# Patient Record
Sex: Female | Born: 1975 | Race: White | Hispanic: No | Marital: Married | Smoking: Former smoker
Health system: Southern US, Community
[De-identification: ages and names within clinical notes are randomized; demographics above are authoritative.]

## PROBLEM LIST (undated history)

## (undated) DIAGNOSIS — B2790 Infectious mononucleosis, unspecified without complication: Secondary | ICD-10-CM

## (undated) DIAGNOSIS — R51 Headache: Secondary | ICD-10-CM

## (undated) DIAGNOSIS — R519 Headache, unspecified: Secondary | ICD-10-CM

## (undated) DIAGNOSIS — D649 Anemia, unspecified: Secondary | ICD-10-CM

## (undated) DIAGNOSIS — N879 Dysplasia of cervix uteri, unspecified: Secondary | ICD-10-CM

## (undated) DIAGNOSIS — K5900 Constipation, unspecified: Secondary | ICD-10-CM

## (undated) DIAGNOSIS — F419 Anxiety disorder, unspecified: Secondary | ICD-10-CM

## (undated) DIAGNOSIS — G43909 Migraine, unspecified, not intractable, without status migrainosus: Secondary | ICD-10-CM

## (undated) HISTORY — DX: Headache, unspecified: R51.9

## (undated) HISTORY — DX: Anxiety disorder, unspecified: F41.9

## (undated) HISTORY — DX: Anemia, unspecified: D64.9

## (undated) HISTORY — PX: CERVICAL BIOPSY  W/ LOOP ELECTRODE EXCISION: SUR135

## (undated) HISTORY — DX: Migraine, unspecified, not intractable, without status migrainosus: G43.909

## (undated) HISTORY — DX: Headache: R51

## (undated) HISTORY — DX: Constipation, unspecified: K59.00

## (undated) HISTORY — DX: Infectious mononucleosis, unspecified without complication: B27.90

## (undated) HISTORY — PX: THYROID CYST EXCISION: SHX2511

## (undated) HISTORY — DX: Dysplasia of cervix uteri, unspecified: N87.9

---

## 2003-11-18 ENCOUNTER — Other Ambulatory Visit: Admission: RE | Admit: 2003-11-18 | Discharge: 2003-11-18 | Payer: Self-pay | Admitting: Obstetrics and Gynecology

## 2004-04-27 ENCOUNTER — Ambulatory Visit: Payer: Self-pay | Admitting: Internal Medicine

## 2004-07-06 ENCOUNTER — Ambulatory Visit: Payer: Self-pay | Admitting: Internal Medicine

## 2004-11-27 ENCOUNTER — Other Ambulatory Visit: Admission: RE | Admit: 2004-11-27 | Discharge: 2004-11-27 | Payer: Self-pay | Admitting: Obstetrics and Gynecology

## 2005-05-14 ENCOUNTER — Ambulatory Visit (HOSPITAL_COMMUNITY): Admission: RE | Admit: 2005-05-14 | Discharge: 2005-05-14 | Payer: Self-pay | Admitting: Obstetrics and Gynecology

## 2005-07-05 ENCOUNTER — Inpatient Hospital Stay (HOSPITAL_COMMUNITY): Admission: AD | Admit: 2005-07-05 | Discharge: 2005-07-08 | Payer: Self-pay | Admitting: Obstetrics and Gynecology

## 2005-10-09 ENCOUNTER — Ambulatory Visit: Payer: Self-pay | Admitting: Internal Medicine

## 2005-10-11 ENCOUNTER — Ambulatory Visit: Payer: Self-pay | Admitting: Internal Medicine

## 2005-12-22 ENCOUNTER — Emergency Department (HOSPITAL_COMMUNITY): Admission: EM | Admit: 2005-12-22 | Discharge: 2005-12-23 | Payer: Self-pay | Admitting: Emergency Medicine

## 2005-12-23 ENCOUNTER — Ambulatory Visit (HOSPITAL_COMMUNITY): Admission: RE | Admit: 2005-12-23 | Discharge: 2005-12-23 | Payer: Self-pay | Admitting: Neurology

## 2005-12-27 ENCOUNTER — Ambulatory Visit: Payer: Self-pay | Admitting: Internal Medicine

## 2005-12-30 ENCOUNTER — Other Ambulatory Visit: Admission: RE | Admit: 2005-12-30 | Discharge: 2005-12-30 | Payer: Self-pay | Admitting: Obstetrics and Gynecology

## 2006-03-21 ENCOUNTER — Encounter: Admission: RE | Admit: 2006-03-21 | Discharge: 2006-03-21 | Payer: Self-pay | Admitting: Neurology

## 2006-07-02 ENCOUNTER — Encounter: Admission: RE | Admit: 2006-07-02 | Discharge: 2006-07-02 | Payer: Self-pay | Admitting: Obstetrics and Gynecology

## 2006-09-15 ENCOUNTER — Ambulatory Visit: Payer: Self-pay | Admitting: Internal Medicine

## 2007-01-02 ENCOUNTER — Ambulatory Visit: Payer: Self-pay | Admitting: Internal Medicine

## 2007-01-02 DIAGNOSIS — M542 Cervicalgia: Secondary | ICD-10-CM | POA: Insufficient documentation

## 2007-01-02 DIAGNOSIS — R87619 Unspecified abnormal cytological findings in specimens from cervix uteri: Secondary | ICD-10-CM | POA: Insufficient documentation

## 2007-01-07 ENCOUNTER — Telehealth: Payer: Self-pay | Admitting: Internal Medicine

## 2007-01-16 ENCOUNTER — Encounter: Admission: RE | Admit: 2007-01-16 | Discharge: 2007-01-16 | Payer: Self-pay | Admitting: Internal Medicine

## 2008-09-09 ENCOUNTER — Inpatient Hospital Stay (HOSPITAL_COMMUNITY): Admission: AD | Admit: 2008-09-09 | Discharge: 2008-09-12 | Payer: Self-pay | Admitting: Obstetrics and Gynecology

## 2010-06-10 LAB — CBC
HCT: 30 % — ABNORMAL LOW (ref 36.0–46.0)
MCHC: 33.2 g/dL (ref 30.0–36.0)
Platelets: 249 10*3/uL (ref 150–400)
RBC: 3.56 MIL/uL — ABNORMAL LOW (ref 3.87–5.11)
RBC: 3.89 MIL/uL (ref 3.87–5.11)
RDW: 14.2 % (ref 11.5–15.5)
WBC: 14.1 10*3/uL — ABNORMAL HIGH (ref 4.0–10.5)

## 2010-06-10 LAB — CCBB MATERNAL DONOR DRAW

## 2010-06-10 LAB — RPR: RPR Ser Ql: NONREACTIVE

## 2010-07-17 NOTE — H&P (Signed)
Tanya Bush, Tanya Bush                ACCOUNT NO.:  [id number]   MEDICAL RECORD NO.:  [id number]          PATIENT TYPE:  INP   LOCATION:  9162                          FACILITY:  [facility]   PHYSICIAN:  Janine Limbo, M.D.DATE OF BIRTH:  Aug 02, 1975   DATE OF ADMISSION:  09/09/2008  DATE OF DISCHARGE:                              HISTORY & PHYSICAL   The patient is a 35 year old married white female gravida 2, para 1-0-0-  1 at 39-2/7 weeks per an [facility] of September 15, 2007, who presents in active  labor.  Reports irregular contractions this evening which increased in  frequency around 10:30 and been approximately 4 to 5 minutes since that  time.  Denied leakage of fluid, vaginal bleeding, UTI or PIH signs or  symptoms, positive fetal movement followed by the MD service at [facility].   HISTORY:  1. History of LEEP in 2002.  2. Patient with mild hearing loss and strong family history of hearing      loss on her father's side of family.  3. Anemia.  4. PENICILLIN allergy.   PRENATAL LABS:  Blood type is O+, Rh antibody screen negative RPR  nonreactive, rubella titer immune.  Hepatitis surface antigen negative,  HIV nonreactive.  Cystic fibrosis screen negative.  Pap smear March,  2009 was within normal limits.  She declined gonorrhea and chlamydia  cultures her hemoglobin December 17 was 12.2, hematocrit 38.3, platelets  were 344.   ALLERGIES:  She has a PENICILLIN allergy.  Denies latex allergies.  Some  seasonal allergies.   MENSTRUAL HISTORY:  Menarche age 12.  She usually has 30 to 35 day  cycles.  Seven days of flow.  Reported an LMP of December 08, 2007, giving  her an [facility] of September 14, 2008.   OBSTETRICAL HISTORY:  Gravida 1, spontaneous vaginal delivery May 2007,  a female named Tanya Bush, born at [redacted] weeks gestation after 12 hours of  labor, weighing 6 pounds 11 ounces.  She did have an epidural.  No  complications.  Was delivered by Dr. Molly Maduro.  Gravida 2 this current  pregnancy.   PAST  MEDICAL HISTORY:  She has used Ortho Tri-Cyclen for contraception  in the past and also had a Mirena placed after her first child.  She had  a history of abnormal Pap, had a LEEP in 2002.  Varicella as a child and  normal childhood illnesses.  She reports that her hemoglobin is usually  low.  Prone to constipation, history of frequent UTIs 2000-2002, but  now rare cystitis.  Does report history of bad knees   FAMILY HISTORY:  Father high blood pressure, maternal uncle and maternal  first cousin diabetic.  Dad has hyperthyroidism.  Maternal uncle kidney  disease.  Maternal uncle, maternal cousin celiac disease.  Maternal  grandfather colon cancer.  Her father had strong history on his side of  the family of hearing loss.  The patient does have mild hearing loss  herself.   GENETIC HISTORY:  Remarkable for father of baby is from [place].   SOCIAL HISTORY:  She is a married  white female.  She is of [place]  faith.  Her husband's name is __________, he is [place], from [place].  The patient has had 17-1/2 years of education.  She is an occupational  therapy.  Her husband has 13 years of education and is full-time Engineer, agricultural.  She denies tobacco, alcohol or illicit drug use.   HISTORY OF PRESENT PREGNANCY:  She entered care November 13 for her new  OB interview.  She was around 5-1/2 weeks.  She reported pre-gravid  weight of 128.  Her weight on December 17 was 136.  She is 5 feet 4  inches, so has an normal BMI.  Her new OB workup was on December 17 with  her prenatal labs drawn.  She had a quad screen that was within normal  limits.  She had anatomy ultrasound at around 19 weeks with normal fluid  and growth and development.  Cervical length was 3.78 cm.  Did start a  Z-Pak middle of March..  She did have a yeast infection during that time  while she was on the Z-Pak, around 23 weeks, and was treated with  Terazol for her 1-week.  The patient did verbalize at 23 weeks  consideration for  postpartum vasectomy.  She did have a Pap sent on  March 17.  Her Glucola was done at 27 weeks and was within normal limits  equal to 108, Hemoglobin at that time was 9.8 and was started on iron.  RPR at the time was nonreactive.  Complained of some charley horses in  her legs around 29 weeks.  She did start measuring a little bit a week  to 2 weeks behind at that time.  Weight was 147.  She had an ultrasound  at 31 weeks for size less than dates.  Had cervical length of 3.41, and  estimated fetal weight was in the 69th percentile.  Fluid was 10th  percentile.  The patient was upset and crying at that visit and had  planned to come back to the office in 3 days.  When she returned, she  had a repeat ultrasound and AFI was 17.28 after vigorous hydration.  Did  complain of some occasional palpitations and shortness of breath.  Had a  TSH checked as well as CMET. Her lungs were clear and regular rate and  rhythm.  GBS was done at 36 weeks and was negative.  She had been about  2 cm in the office at 36 weeks.  Complained of some decreased fetal  movement on July 6 and was sent for an ultrasound on July 6.  Estimated  fetal weight was in the 68th.5th percentile and was approximately 7  pounds 11 ounces.  AFI was 11.52, which was 35th percentile.  BPP was  8/10.  A vertex placenta was grade 3 anterior.  The patient did have an  appointment in the office today but the records are not available.  Her  weight on July 6 was 151.   OBJECTIVE:  VITAL SIGNS ON ADMISSION:  Blood pressure 129/67, heart rate  77, temperature 978 and respirations were 21.  CBC white blood cell count 12.5, hemoglobin 10.8, hematocrit 32.6,  platelets were 338.  GENERAL:  She was then noted distress and had significant discomfort was  alert, oriented x3 heat within normal limits.  CARDIOVASCULAR:  Regular  rate and rhythm without murmur.  LUNGS:  Clear to auscultation bilaterally.  ABDOMEN:  Soft, nontender, gravid.   Cervix 4-5 cm 100% -  1 with bulging  membranes.  EXTREMITIES: No edema are clonus and DTRs 2+.  Fetal heart rate 135  reactive, no D cells, moderate variability.  Toco uterine contractions  irregular every 2-6 minutes, moderate on palpation.   IMPRESSION:  1. Intrauterine pregnancy at 39-2.  2. Early active labor.  3. Reactive fetal heart tracing.  4. Group beta strep negative.  5. Desires epidural.   PLAN:  1. Admit to birthing suites with Dr. Stefano Gaul as attending physician.  2. Routine L&D orders.  3. Epidural as soon as possible.  4. MD is to follow and plan artificial rupture of membranes for      augmentation.      Candice Denny Levy, [place]      Janine Limbo, M.D.  Electronically Signed    CHS/MEDQ  D:  09/10/2008  T:  09/10/2008  Job:  [id number]

## 2010-07-20 NOTE — H&P (Signed)
NAMEANSLIE, SPADAFORA NO.:  [id number]   MEDICAL RECORD NO.:  [id number]          PATIENT TYPE:  INP   LOCATION:  9169                          [facility]  [facility]   PHYSICIAN:  Osborn Coho, M.D.   DATE OF BIRTH:  11/11/1975   DATE OF ADMISSION:  07/05/2005  DATE OF DISCHARGE:                                HISTORY & PHYSICAL   HISTORY OF PRESENT ILLNESS:  Tanya Bush is a 35 year old, gravida 1, para 0  who was admitted at 39-4/[redacted] weeks gestation with complaints of painful  regular contractions throughout the evening. The patient denies any vaginal  bleeding and reports her fetus is moving normally. The patient questions  whether she may be leaking some amniotic fluid due to some increased thin  discharge. The patient was previously evaluated at [facility] office in the a.m.  of the day of admission for a similar complaint and was found to have no  leakage of amniotic fluid and cervix 3-4 cm at that time. The patient's  pregnancy remarkable for:   1.  History of abnormal Pap status post LEEP.  2.  Toxo risk.  3.  Family and patient with hearing loss.   PRENATAL LABS:  Hemoglobin 12.9, platelets 355,000, blood type O+, antibody  screen negative. RPR nonreactive, rubella titer immune. Hepatitis B surface  antigen negative, HIV nonreactive. Cystic fibrosis negative. Toxotiters IgG,  IgM respectively were both negative. Quad screen within normal limits.  Glucola at 27 weeks 113, hemoglobin at 27 weeks 10.0. Group B strep  negative. GC chlamydia cultures declined at 36 weeks.   HISTORY OF PRESENT PREGNANCY:  The patient entered care at [redacted] weeks  gestation. Ultrasound obtained at that time consistent with 8 weeks and 1  day with [facility] Jul 08, 2005 which is the patient's best EDC secondary to a  questionable LMP. The patient's pregnancy has been followed by the MD  service at [facility]. The patient underwent nuchal  translucency screening only which was within  normal limits. Quad screen  obtained at 7 weeks was within normal limits. Ultrasound for anatomy done at  19 weeks with normal anatomy, female infant, cervix 3.7 cm and normal  amniotic fluid. Patient with some complaints of nasal congestion at 27 weeks  and patient started on Tandem at 27 weeks secondary to a hemoglobin of 10.0.  Patient with complaints of left upper quadrant pain at [redacted] weeks gestation  with benign findings. The patient declined third trimester GC and chlamydia  36 weeks. Cervix 2 cm at 37 weeks and 3 cm at 38 weeks and 4 days. Patient  with episode of shortness of breath and slightly elevated heart rate at 39  weeks which resolved spontaneously without syncope. The remainder of  pregnancy is unremarkable.   OB HISTORY:  Pregnancy #1.   GYN HISTORY:  Patient with 30-35 day cycles with menarche at age 49. Patient  with a history of OCP use from 23 to 2000 and the condom. 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 Patient with a LEEP in 2002. Last 3 Paps  were all  within normal limits with the last Pap being September 2005 prior  to onset of prenatal care. Patient with a history of HPV and occasional  yeast however denies other STDs.   PAST MEDICAL HISTORY:  Significant for mild anemia and prone to constipation  and history of frequent UTIs in the past. Complaints of joint problems.   CURRENT MEDICATIONS:  Prenatal vitamins and Zyrtec.   ALLERGIES:  PENICILLIN questionably.   PAST SURGICAL HISTORY:  LEEP only.   FAMILY HISTORY:  Father with hypertension, hyperthyroidism.   GENETIC HISTORY:  The patient's father and his family as well as patient  with mild hearing loss otherwise negative genetic history.   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 Father of the baby, Cait Locust, with 13 years of education is  involved and supportive and works in Firefighter. The patient and  her partner are  [place] in their faith. The patient denies the use of  alcohol, tobacco or street drugs.   PHYSICAL EXAMINATION:  VITAL SIGNS:  The patient is afebrile, vital signs  are stable.  HEENT:  Within normal limits.  LUNGS:  Clear.  HEART:  Regular rate and rhythm.  BREASTS:  Soft.  ABDOMEN:  Soft, gravida and nontender. Fetal heart rate 140s and reassuring.  Uterine contractions every 3-5 minutes.  CERVICAL EXAM:  Found cervix to be 3-4 cm dilated, 75% effaced, -2, -3  station.  EXTREMITIES:  No edema.   ASSESSMENT:  1.  Intrauterine pregnancy at 39-4/7 weeks.  2.  Early labor.   PLAN:  The patient to be admitted to birthing suite per consult with Dr.  Su Hilt. Routine MD orders. The patient may plan epidural during the course  of her labor however, she declines pain medicine at the present moment.      Tanya Bush, CNM      Osborn Coho, M.D.  Electronically Signed    NOS/MEDQ  D:  07/06/2005  T:  07/06/2005  Job:  [id number]

## 2011-02-01 ENCOUNTER — Other Ambulatory Visit: Payer: Self-pay | Admitting: Family Medicine

## 2011-02-01 DIAGNOSIS — E041 Nontoxic single thyroid nodule: Secondary | ICD-10-CM

## 2011-02-04 ENCOUNTER — Ambulatory Visit
Admission: RE | Admit: 2011-02-04 | Discharge: 2011-02-04 | Disposition: A | Discharge status: Home / Self Care | Payer: 59 | Source: Ambulatory Visit | Attending: Family Medicine | Admitting: Family Medicine

## 2011-02-04 DIAGNOSIS — E041 Nontoxic single thyroid nodule: Secondary | ICD-10-CM

## 2011-02-08 ENCOUNTER — Other Ambulatory Visit: Payer: Self-pay

## 2011-02-08 ENCOUNTER — Other Ambulatory Visit: Payer: Self-pay | Admitting: Family Medicine

## 2011-02-08 DIAGNOSIS — E041 Nontoxic single thyroid nodule: Secondary | ICD-10-CM

## 2011-02-12 ENCOUNTER — Other Ambulatory Visit (HOSPITAL_COMMUNITY)
Admission: RE | Admit: 2011-02-12 | Discharge: 2011-02-12 | Disposition: A | Discharge status: Home / Self Care | Payer: 59 | Source: Ambulatory Visit | Attending: Interventional Radiology | Admitting: Interventional Radiology

## 2011-02-12 ENCOUNTER — Ambulatory Visit
Admission: RE | Admit: 2011-02-12 | Discharge: 2011-02-12 | Disposition: A | Discharge status: Home / Self Care | Payer: 59 | Source: Ambulatory Visit | Attending: Family Medicine | Admitting: Family Medicine

## 2011-02-12 DIAGNOSIS — E049 Nontoxic goiter, unspecified: Secondary | ICD-10-CM | POA: Insufficient documentation

## 2011-02-12 DIAGNOSIS — E041 Nontoxic single thyroid nodule: Secondary | ICD-10-CM

## 2011-03-27 ENCOUNTER — Other Ambulatory Visit: Payer: Self-pay | Admitting: Endocrinology

## 2011-03-27 DIAGNOSIS — E041 Nontoxic single thyroid nodule: Secondary | ICD-10-CM

## 2011-09-09 ENCOUNTER — Ambulatory Visit
Admission: RE | Admit: 2011-09-09 | Discharge: 2011-09-09 | Disposition: A | Discharge status: Home / Self Care | Payer: 59 | Source: Ambulatory Visit | Attending: Endocrinology | Admitting: Endocrinology

## 2011-09-09 ENCOUNTER — Other Ambulatory Visit: Payer: 59

## 2011-09-09 DIAGNOSIS — E041 Nontoxic single thyroid nodule: Secondary | ICD-10-CM

## 2012-01-07 ENCOUNTER — Encounter: Payer: Self-pay | Admitting: Obstetrics and Gynecology

## 2012-01-07 ENCOUNTER — Ambulatory Visit (INDEPENDENT_AMBULATORY_CARE_PROVIDER_SITE_OTHER): Payer: 59 | Admitting: Obstetrics and Gynecology

## 2012-01-07 VITALS — BP 104/78 | HR 66 | Ht 64.0 in | Wt 138.0 lb

## 2012-01-07 DIAGNOSIS — R51 Headache: Secondary | ICD-10-CM

## 2012-01-07 DIAGNOSIS — Z01419 Encounter for gynecological examination (general) (routine) without abnormal findings: Secondary | ICD-10-CM

## 2012-01-07 DIAGNOSIS — Z124 Encounter for screening for malignant neoplasm of cervix: Secondary | ICD-10-CM

## 2012-01-07 DIAGNOSIS — R519 Headache, unspecified: Secondary | ICD-10-CM

## 2012-01-07 MED ORDER — BUTALBITAL-APAP-CAFFEINE 50-325-40 MG PO TABS: 1.0000 | ORAL_TABLET | Freq: Four times a day (QID) | ORAL | Status: AC | PRN

## 2012-01-07 NOTE — Progress Notes (Signed)
ANNUAL GYNECOLOGIC EXAMINATION   Tanya Bush is a 36 y.o. female, G2P2, who presents for an annual exam. The patient had a Mirena IUD placed in 2010.  Her menstrual cycles are better.  She continues to have PMS symptoms.  She reports having headaches for one week prior to her cycle.  This has been better the last 2 cycles.  It was quite uncomfortable over the summer.  The patient has a thyroid nodule that was found to be benign by her endocrinologist.  She complains of trouble controlling her weight. The patient had a conization in 2006 for a high-grade lesion.  Her most recent Pap smears have been normal.   History   Social History  . Marital Status: Married    Spouse Name: N/A    Number of Children: N/A  . Years of Education: N/A   Social History Main Topics  . Smoking status: Never Smoker   . Smokeless tobacco: None  . Alcohol Use: Yes     Comment: ocassionally   . Drug Use: No  . Sexually Active: None     Comment: Mirena inserted 08/2005   Other Topics Concern  . None   Social History Narrative  . None    Menstrual cycle:   LMP: No LMP recorded.             The following portions of the patient's history were reviewed and updated as appropriate: allergies, current medications, past family history, past medical history, past social history, past surgical history and problem list.  Review of Systems Pertinent items are noted in HPI. Breast:Negative for breast lump,nipple discharge or nipple retraction Gastrointestinal: Negative for abdominal pain, change in bowel habits or rectal bleeding Urinary:negative   Objective:    BP 104/78  Pulse 66  Ht 5\' 4"  (1.626 m)  Wt 138 lb (62.596 kg)  BMI 23.69 kg/m2    Weight:  Wt Readings from Last 1 Encounters:  01/07/12 138 lb (62.596 kg)          BMI: Body mass index is 23.69 kg/(m^2).  General Appearance: Alert, appropriate appearance for age. No acute distress HEENT: Grossly normal Neck / Thyroid: Supple, no masses,  nodes or enlargement Lungs: clear to auscultation bilaterally Back: No CVA tenderness Breast Exam: No masses or nodes.No dimpling, nipple retraction or discharge. Cardiovascular: Regular rate and rhythm. S1, S2, no murmur Gastrointestinal: Soft, non-tender, no masses or organomegaly  ++++++++++++++++++++++++++++++++++++++++++++++++++++++++  Pelvic Exam: External genitalia: normal general appearance Vaginal: normal without tenderness, induration or masses. Relaxation: Yes Cervix: normal appearance Adnexa: normal bimanual exam Uterus: normal size, shape, and consistency Rectovaginal: normal rectal, no masses  ++++++++++++++++++++++++++++++++++++++++++++++++++++++++  Lymphatic Exam: Non-palpable nodes in neck, clavicular, axillary, or inguinal regions Neurologic: Normal speech, no tremor  Psychiatric: Alert and oriented, appropriate affect.  Assessment:    Normal gyn exam   Overweight or obese: Yes   Pelvic relaxation: Yes  Headaches  PMS  Conization of the cervix in 2006 because of a high grade lesion.   Plan:    pap smear return annually or prn Contraception:IUD   Medications prescribed: Fioricet for headaches  STD screen request: No   The updated Pap smear screening guidelines were discussed with the patient. The patient requested that I obtain a Pap smear: Yes.  Kegel exercises discussed: Yes.  Proper diet and regular exercise were reviewed.  Annual mammograms recommended starting at age 55. Proper breast care was discussed.  Screening colonoscopy is recommended beginning at age 32.  Regular health  maintenance was reviewed.  Sleep hygiene was discussed.  Adequate calcium and vitamin D intake was emphasized.  Leonard Schwartz M.D.    Regular Periods: yes Mammogram: no  Monthly Breast Ex.: no Exercise: yes sometimes  Tetanus < 10 years: yes Seatbelts: yes  NI. Bladder Functn.: yes Abuse at home: no  Daily BM's: yes Stressful Work: yes    Healthy Diet: yes Sigmoid-Colonoscopy: n/a  Calcium: no Medical problems this year: headaches before periods   LAST PAP:08/01/10  Contraception: mirena  Mammogram:  n/a  PCP: Dr. Zachery Dauer at [facility] on [address]  PMH:  No changes  FMH: no changes  Last Bone Scan: n/a

## 2012-01-08 LAB — PAP IG W/ RFLX HPV ASCU

## 2012-11-20 ENCOUNTER — Other Ambulatory Visit: Payer: Self-pay | Admitting: Endocrinology

## 2012-11-20 DIAGNOSIS — E049 Nontoxic goiter, unspecified: Secondary | ICD-10-CM

## 2012-11-27 ENCOUNTER — Ambulatory Visit
Admission: RE | Admit: 2012-11-27 | Discharge: 2012-11-27 | Disposition: A | Discharge status: Home / Self Care | Payer: 59 | Source: Ambulatory Visit | Attending: Endocrinology | Admitting: Endocrinology

## 2012-11-27 DIAGNOSIS — E049 Nontoxic goiter, unspecified: Secondary | ICD-10-CM

## 2014-01-03 ENCOUNTER — Encounter: Payer: Self-pay | Admitting: Obstetrics and Gynecology

## 2014-04-16 ENCOUNTER — Inpatient Hospital Stay (HOSPITAL_COMMUNITY)
Admission: AD | Admit: 2014-04-16 | Discharge: 2014-04-16 | Disposition: A | Discharge status: Home / Self Care | Payer: 59 | Source: Ambulatory Visit | Attending: Obstetrics and Gynecology | Admitting: Obstetrics and Gynecology

## 2014-04-16 ENCOUNTER — Encounter (HOSPITAL_COMMUNITY): Payer: Self-pay | Admitting: *Deleted

## 2014-04-16 ENCOUNTER — Inpatient Hospital Stay (HOSPITAL_COMMUNITY): Payer: 59

## 2014-04-16 DIAGNOSIS — R58 Hemorrhage, not elsewhere classified: Secondary | ICD-10-CM

## 2014-04-16 DIAGNOSIS — N939 Abnormal uterine and vaginal bleeding, unspecified: Secondary | ICD-10-CM | POA: Insufficient documentation

## 2014-04-16 LAB — URINALYSIS, ROUTINE W REFLEX MICROSCOPIC
BILIRUBIN URINE: NEGATIVE
Glucose, UA: NEGATIVE mg/dL
KETONES UR: NEGATIVE mg/dL
NITRITE: NEGATIVE
PH: 5.5 (ref 5.0–8.0)
PROTEIN: NEGATIVE mg/dL
Urobilinogen, UA: 0.2 mg/dL (ref 0.0–1.0)

## 2014-04-16 LAB — CBC WITH DIFFERENTIAL/PLATELET
BASOS ABS: 0 10*3/uL (ref 0.0–0.1)
BASOS PCT: 0 % (ref 0–1)
EOS ABS: 0.2 10*3/uL (ref 0.0–0.7)
EOS PCT: 2 % (ref 0–5)
HEMATOCRIT: 37.8 % (ref 36.0–46.0)
HEMOGLOBIN: 12.9 g/dL (ref 12.0–15.0)
LYMPHS PCT: 15 % (ref 12–46)
Lymphs Abs: 1.7 10*3/uL (ref 0.7–4.0)
MCH: 30.1 pg (ref 26.0–34.0)
MCHC: 34.1 g/dL (ref 30.0–36.0)
MCV: 88.3 fL (ref 78.0–100.0)
Monocytes Absolute: 0.7 10*3/uL (ref 0.1–1.0)
Monocytes Relative: 6 % (ref 3–12)
NEUTROS PCT: 77 % (ref 43–77)
Neutro Abs: 8.2 10*3/uL — ABNORMAL HIGH (ref 1.7–7.7)
PLATELETS: 312 10*3/uL (ref 150–400)
RBC: 4.28 MIL/uL (ref 3.87–5.11)
RDW: 13 % (ref 11.5–15.5)
WBC: 10.7 10*3/uL — ABNORMAL HIGH (ref 4.0–10.5)

## 2014-04-16 LAB — URINE MICROSCOPIC-ADD ON

## 2014-04-16 LAB — HCG, QUANTITATIVE, PREGNANCY

## 2014-04-16 LAB — POCT PREGNANCY, URINE: PREG TEST UR: NEGATIVE

## 2014-04-16 MED ORDER — NORETHINDRONE 0.35 MG PO TABS
1.0000 | ORAL_TABLET | Freq: Every day | ORAL | Status: DC
Start: 2014-04-16 — End: 2014-12-19

## 2014-04-16 NOTE — Discharge Instructions (Signed)
Abnormal Uterine Bleeding Abnormal uterine bleeding can affect women at various stages in life, including teenagers, women in their reproductive years, pregnant women, and women who have reached menopause. Several kinds of uterine bleeding are considered abnormal, including:  Bleeding or spotting between periods.   Bleeding after sexual intercourse.   Bleeding that is heavier or more than normal.   Periods that last longer than usual.  Bleeding after menopause.  Many cases of abnormal uterine bleeding are minor and simple to treat, while others are more serious. Any type of abnormal bleeding should be evaluated by your health care provider. Treatment will depend on the cause of the bleeding. HOME CARE INSTRUCTIONS Monitor your condition for any changes. The following actions may help to alleviate any discomfort you are experiencing:  Avoid the use of tampons and douches as directed by your health care provider.  Change your pads frequently. You should get regular pelvic exams and Pap tests. Keep all follow-up appointments for diagnostic tests as directed by your health care provider.  SEEK MEDICAL CARE IF:   Your bleeding lasts more than 1 week.   You feel dizzy at times.  SEEK IMMEDIATE MEDICAL CARE IF:   You pass out.   You are changing pads every 15 to 30 minutes.   You have abdominal pain.  You have a fever.   You become sweaty or weak.   You are passing large blood clots from the vagina.   You start to feel nauseous and vomit. MAKE SURE YOU:   Understand these instructions.  Will watch your condition.  Will get help right away if you are not doing well or get worse. Document Released: 02/18/2005 Document Revised: 02/23/2013 Document Reviewed: 09/17/2012 ExitCare Patient Information 2015 [employer]. This information is not intended to replace advice given to you by your health care provider. Make sure you discuss any questions you have with your  health care provider.  

## 2014-04-16 NOTE — MAU Provider Note (Signed)
MAU Addendum Note  Results for orders placed or performed during the hospital encounter of 04/16/14 (from the past 24 hour(s))  Urinalysis, Routine w reflex microscopic     Status: Abnormal   Collection Time: 04/16/14  2:10 PM  Result Value Ref Range   Color, Urine RED (A) YELLOW   APPearance CLOUDY (A) CLEAR   Specific Gravity, Urine <1.005 (L) 1.005 - 1.030   pH 5.5 5.0 - 8.0   Glucose, UA NEGATIVE NEGATIVE mg/dL   Hgb urine dipstick LARGE (A) NEGATIVE   Bilirubin Urine NEGATIVE NEGATIVE   Ketones, ur NEGATIVE NEGATIVE mg/dL   Protein, ur NEGATIVE NEGATIVE mg/dL   Urobilinogen, UA 0.2 0.0 - 1.0 mg/dL   Nitrite NEGATIVE NEGATIVE   Leukocytes, UA TRACE (A) NEGATIVE  Urine microscopic-add on     Status: None   Collection Time: 04/16/14  2:10 PM  Result Value Ref Range   WBC, UA 0-2 <3 WBC/hpf   RBC / HPF TOO NUMEROUS TO COUNT <3 RBC/hpf  Pregnancy, urine POC     Status: None   Collection Time: 04/16/14  2:26 PM  Result Value Ref Range   Preg Test, Ur NEGATIVE NEGATIVE  CBC with Differential     Status: Abnormal   Collection Time: 04/16/14  3:05 PM  Result Value Ref Range   WBC 10.7 (H) 4.0 - 10.5 K/uL   RBC 4.28 3.87 - 5.11 MIL/uL   Hemoglobin 12.9 12.0 - 15.0 g/dL   HCT 47.837.8 29.536.0 - 62.146.0 %   MCV 88.3 78.0 - 100.0 fL   MCH 30.1 26.0 - 34.0 pg   MCHC 34.1 30.0 - 36.0 g/dL   RDW 30.813.0 65.711.5 - 84.615.5 %   Platelets 312 150 - 400 K/uL   Neutrophils Relative % 77 43 - 77 %   Neutro Abs 8.2 (H) 1.7 - 7.7 K/uL   Lymphocytes Relative 15 12 - 46 %   Lymphs Abs 1.7 0.7 - 4.0 K/uL   Monocytes Relative 6 3 - 12 %   Monocytes Absolute 0.7 0.1 - 1.0 K/uL   Eosinophils Relative 2 0 - 5 %   Eosinophils Absolute 0.2 0.0 - 0.7 K/uL   Basophils Relative 0 0 - 1 %   Basophils Absolute 0.0 0.0 - 0.1 K/uL  hCG, quantitative, pregnancy     Status: None   Collection Time: 04/16/14  3:05 PM  Result Value Ref Range   hCG, Beta Chain, Quant, S <1 <5 mIU/mL   Filed Vitals:   04/16/14 1421  BP:  138/83  Pulse: 86  Temp: 97.9 F (36.6 C)  Resp: 18  Height: 5\' 4"  (1.626 m)  Weight: 144 lb (65.318 kg)   US IMPRESSION: Uterus Measurements: 8.1 x 4.8 x 4.7 cm. No fibroids or other mass visualized. Endometrium Thickness: 7 mm. No focal abnormality visualized. Right ovary Measurements: 2.7 x 2.1 x 1.2 cm. Normal appearance/no adnexal mass. Left ovary Measurements: 3.2 x 2.0 x 1.8 cm. Normal appearance/no adnexal mass. Trace free fluid in the pelvis. Otherwise unremarkable pelvic ultrasound.  Plan: -US negative, irregular bleeding due to recent Mirena removal will start on Micronor until pt is able to have Mirena replaced -Discussed need to follow up in office for Mirena placement -Bleeding Precautions given -Encouraged to call if any questions or concerns arise prior to next scheduled office visit.  -Discharged to home in stable condition Consulted with Dr. Waymond Cerazan   Venus Standard, CNM, MSN 04/16/2014. 4:38 PM

## 2014-04-16 NOTE — MAU Note (Signed)
Pt presents to MAU with complaints of heavy vaginal bleeding with clots that started today. Pt reports that she had her IUD removed on Wednesday.

## 2014-04-16 NOTE — MAU Provider Note (Signed)
Tanya Bush is a 39 y.o. G2P2 female who arrived in MAU c/o vaginal bleeding s/p Mirena IUD removal the other day in the office by Dr Stefano GaulStringer.  She reports she received this IUD in 2011 for birth control, 6 months after she gave birth.  She has increase "bleeding for 3 month then it settled" and she continued to have a monthly period ever since.    Wednesday she went to the office to have the IUD removed and replaced. She was experiencing increased pain so she did not receive a replacement IUD.  She was instructed to wait a few weeks before trying and take cytotec before the appointment.   Since Wednesday, she noted some spotting but today she had a gush of blood and passed a golf ball size clot.  Since being the in the MAU she has continue to bleed and pass clots.  She denies pain or cramping.  She denies a hx of high BP, heart or liver disease.  She is a non smoker.  She is very teary eye and upset with fear of the cause.       History     Patient Active Problem List   Diagnosis Date Noted  . NECK PAIN 01/02/2007  . PAP SMEAR, ABNORMAL 01/02/2007    No chief complaint on file.  HPI  OB History    Gravida Para Term Preterm AB TAB SAB Ectopic Multiple Living   2 2        2       No past medical history on file.  No past surgical history on file.  No family history on file.  History  Substance Use Topics  . Smoking status: Never Smoker   . Smokeless tobacco: Not on file  . Alcohol Use: Yes     Comment: ocassionally     Allergies: No Known Allergies  Prescriptions prior to admission  Medication Sig Dispense Refill Last Dose  . levonorgestrel (MIRENA) 20 MCG/24HR IUD 1 each by Intrauterine route once.   Taking    ROS See HPI above, all other systems are negative  Physical Exam   BP 123/76 mmHg  Pulse 101  Temp(Src) 97.9 F (36.6 C)  Resp 18  Ht 5\' 4"  (1.626 m)  Wt 65.318 kg (144 lb)  BMI 24.71 kg/m2  LMP 04/04/2014  Physical Exam Ext:  WNL ABD: Soft, non  tender to palpation, no rebound or guarding SVE: wnl, closed   ED Course  Assessment: Unexplained vaginal bleeding    Plan: Suspect abnormal bleeding due to recent Mirena removal Labs: CBC, quant, ABD US to rule out underlying etiology    Venus Standard, CNM, MSN 04/16/2014. 2:19 PM

## 2014-10-12 ENCOUNTER — Other Ambulatory Visit: Payer: Self-pay | Admitting: Family Medicine

## 2014-10-12 DIAGNOSIS — R519 Headache, unspecified: Secondary | ICD-10-CM

## 2014-10-12 DIAGNOSIS — R51 Headache: Principal | ICD-10-CM

## 2014-10-12 DIAGNOSIS — E049 Nontoxic goiter, unspecified: Secondary | ICD-10-CM

## 2014-10-13 ENCOUNTER — Emergency Department (HOSPITAL_BASED_OUTPATIENT_CLINIC_OR_DEPARTMENT_OTHER)
Admission: EM | Admit: 2014-10-13 | Discharge: 2014-10-13 | Disposition: A | Discharge status: Home / Self Care | Payer: 59 | Attending: Emergency Medicine | Admitting: Emergency Medicine

## 2014-10-13 ENCOUNTER — Emergency Department (HOSPITAL_BASED_OUTPATIENT_CLINIC_OR_DEPARTMENT_OTHER): Payer: 59

## 2014-10-13 ENCOUNTER — Encounter (HOSPITAL_BASED_OUTPATIENT_CLINIC_OR_DEPARTMENT_OTHER): Payer: Self-pay | Admitting: *Deleted

## 2014-10-13 DIAGNOSIS — R51 Headache: Secondary | ICD-10-CM | POA: Diagnosis present

## 2014-10-13 DIAGNOSIS — R519 Headache, unspecified: Secondary | ICD-10-CM

## 2014-10-13 DIAGNOSIS — M546 Pain in thoracic spine: Secondary | ICD-10-CM | POA: Insufficient documentation

## 2014-10-13 DIAGNOSIS — M542 Cervicalgia: Secondary | ICD-10-CM | POA: Diagnosis not present

## 2014-10-13 DIAGNOSIS — Z793 Long term (current) use of hormonal contraceptives: Secondary | ICD-10-CM | POA: Diagnosis not present

## 2014-10-13 MED ORDER — HYDROCODONE-ACETAMINOPHEN 5-325 MG PO TABS: 1.0000 | ORAL_TABLET | Freq: Four times a day (QID) | ORAL | Status: DC | PRN

## 2014-10-13 MED ORDER — KETOROLAC TROMETHAMINE 60 MG/2ML IM SOLN
60.0000 mg | Freq: Once | INTRAMUSCULAR | Status: AC
  Administered 2014-10-13: 60 mg via INTRAMUSCULAR
  Filled 2014-10-13: qty 2

## 2014-10-13 NOTE — ED Provider Notes (Signed)
CSN: [id number]     Arrival date & time 10/13/14  6387 History   First MD Initiated Contact with Patient 10/13/14 1014     Chief Complaint  Patient presents with  . Back Pain  . Headache     (Consider location/radiation/quality/duration/timing/severity/associated sxs/prior Treatment) Patient is a 39 y.o. female presenting with back pain and headaches.  Back Pain Location:  Thoracic spine Quality:  Shooting Radiates to: Neck to posterior scalp. Down back to lower thoracic region. Pain severity:  Severe Onset quality:  Gradual Duration:  5 days Timing:  Constant Progression:  Worsening (Last night was particularly bad. With 10 out of 10 pain.) Chronicity:  New Context: not recent injury   Relieved by:  Nothing Exacerbated by: Laying flat, trying to sleep. Ineffective treatments: NSAIDs, Flexeril. Associated symptoms: headaches   Associated symptoms: no abdominal pain, no fever, no numbness, no tingling and no weakness   Headache Pain location:  Occipital Severity currently:  7/10 Severity at highest:  10/10 Onset quality:  Gradual Timing:  Constant Associated symptoms: back pain and neck pain   Associated symptoms: no abdominal pain, no fever, no numbness, no visual change and no weakness     History reviewed. No pertinent past medical history. Past Surgical History  Procedure Laterality Date  . Cervical biopsy  w/ loop electrode excision    . Thyroid cyst excision     No family history on file. Social History  Substance Use Topics  . Smoking status: Never Smoker   . Smokeless tobacco: Never Used  . Alcohol Use: Yes     Comment: ocassionally weekend   OB History    Gravida Para Term Preterm AB TAB SAB Ectopic Multiple Living   2 2        4      Review of Systems  Constitutional: Negative for fever.  Gastrointestinal: Negative for abdominal pain.  Musculoskeletal: Positive for back pain and neck pain.  Neurological: Positive for headaches. Negative for  tingling, weakness and numbness.  All other systems reviewed and are negative.     Allergies  Review of patient's allergies indicates no known allergies.  Home Medications   Prior to Admission medications   Medication Sig Start Date End Date Taking? Authorizing Provider  norethindrone (ORTHO MICRONOR) 0.35 MG tablet Take 1 tablet (0.35 mg total) by mouth daily. 04/16/14   Venus Standard, CNM   BP 115/83 mmHg  Pulse 82  Temp(Src) 98.2 F (36.8 C) (Oral)  Resp 16  Ht 5\' 4"  (1.626 m)  Wt 140 lb (63.504 kg)  BMI 24.02 kg/m2  SpO2 99% Physical Exam  Constitutional: She is oriented to person, place, and time. She appears well-developed and well-nourished. No distress.  HENT:  Head: Normocephalic and atraumatic.  Eyes: Conjunctivae are normal. No scleral icterus.  Neck: Normal range of motion. Neck supple. Muscular tenderness present.  Cardiovascular: Normal rate and intact distal pulses.   Pulmonary/Chest: Effort normal. No stridor. No respiratory distress.  Abdominal: Normal appearance. She exhibits no distension.  Musculoskeletal:       Thoracic back: She exhibits tenderness (upper thoracic and lower cervical paraspinal musculature.  trapezius bilateral.). She exhibits no swelling and no edema.  Neurological: She is alert and oriented to person, place, and time.  Normal upper and lower extremity strength bilaterally. Normal sensation.  Skin: Skin is warm and dry. No rash noted.  Psychiatric: She has a normal mood and affect. Her behavior is normal.  Nursing note and vitals reviewed.   ED  Course  Procedures (including critical care time) Labs Review Labs Reviewed - No data to display  Imaging Review Ct Head Wo Contrast  10/13/2014   CLINICAL DATA:  Headache, no known injury  EXAM: CT HEAD WITHOUT CONTRAST  CT CERVICAL SPINE WITHOUT CONTRAST  TECHNIQUE: Multidetector CT imaging of the head and cervical spine was performed following the standard protocol without intravenous  contrast. Multiplanar CT image reconstructions of the cervical spine were also generated.  COMPARISON:  None.  FINDINGS: CT HEAD FINDINGS  No skull fracture is noted. Paranasal sinuses and mastoid air cells are unremarkable.  No intracranial hemorrhage, mass effect or midline shift.  No acute cortical infarction. No hydrocephalus. No mass lesion is noted on this unenhanced scan.  CT CERVICAL SPINE FINDINGS  Axial images of the cervical spine shows no acute fracture or subluxation. Computer processed images shows no acute fracture or subluxation. There is no prevertebral soft tissue swelling. Cervical airway is patent. Spinal canal is patent. Alignment and vertebral body heights are preserved.  IMPRESSION: 1. No acute intracranial abnormality. 2. No cervical spine acute fracture or subluxation.   Electronically Signed   By: Natasha Mead M.D.   On: 10/13/2014 11:19   Ct Cervical Spine Wo Contrast  10/13/2014   CLINICAL DATA:  Headache, no known injury  EXAM: CT HEAD WITHOUT CONTRAST  CT CERVICAL SPINE WITHOUT CONTRAST  TECHNIQUE: Multidetector CT imaging of the head and cervical spine was performed following the standard protocol without intravenous contrast. Multiplanar CT image reconstructions of the cervical spine were also generated.  COMPARISON:  None.  FINDINGS: CT HEAD FINDINGS  No skull fracture is noted. Paranasal sinuses and mastoid air cells are unremarkable.  No intracranial hemorrhage, mass effect or midline shift.  No acute cortical infarction. No hydrocephalus. No mass lesion is noted on this unenhanced scan.  CT CERVICAL SPINE FINDINGS  Axial images of the cervical spine shows no acute fracture or subluxation. Computer processed images shows no acute fracture or subluxation. There is no prevertebral soft tissue swelling. Cervical airway is patent. Spinal canal is patent. Alignment and vertebral body heights are preserved.  IMPRESSION: 1. No acute intracranial abnormality. 2. No cervical spine acute  fracture or subluxation.   Electronically Signed   By: Natasha Mead M.D.   On: 10/13/2014 11:19     EKG Interpretation None      MDM   Final diagnoses:  Headache  Neck pain  Bilateral thoracic back pain    39 yo female with upper back and neck pain for past 5 days.  Last night was very painful.  She discussed with her PCP about getting CT imaging.  Pt very much desires to get this imaging in the ED today.    I suspect pain is MSK in nature. No neurologic symptoms.  Not c/w SAH or meningitis.    CT's negative. Plan DC with outpatient follow-up.  Blake Divine, MD 10/13/14 [phone]

## 2014-10-13 NOTE — ED Notes (Signed)
Pt directed to pharmacy to pick up medications- ride present at bedside- f/u care discussed

## 2014-10-13 NOTE — ED Notes (Signed)
Pt reports headache and neck pain since Saturday- reports pain now extends down through thoracic spine- saw PCP on Monday and has CT scan ordered for next week but pain is now worse- started on naprosyn and flexeril with some relief initially but now pain is worse

## 2014-10-13 NOTE — ED Notes (Signed)
MD at bedside. 

## 2014-10-14 ENCOUNTER — Ambulatory Visit
Admission: RE | Admit: 2014-10-14 | Discharge: 2014-10-14 | Disposition: A | Discharge status: Home / Self Care | Payer: 59 | Source: Ambulatory Visit | Attending: Family Medicine | Admitting: Family Medicine

## 2014-10-14 DIAGNOSIS — E049 Nontoxic goiter, unspecified: Secondary | ICD-10-CM

## 2014-10-18 ENCOUNTER — Ambulatory Visit (INDEPENDENT_AMBULATORY_CARE_PROVIDER_SITE_OTHER): Payer: 59 | Admitting: Neurology

## 2014-10-18 ENCOUNTER — Encounter: Payer: Self-pay | Admitting: Neurology

## 2014-10-18 VITALS — BP 118/66 | HR 82 | Resp 20 | Ht 64.0 in | Wt 141.5 lb

## 2014-10-18 DIAGNOSIS — R51 Headache: Secondary | ICD-10-CM

## 2014-10-18 DIAGNOSIS — M542 Cervicalgia: Secondary | ICD-10-CM | POA: Diagnosis not present

## 2014-10-18 DIAGNOSIS — G4486 Cervicogenic headache: Secondary | ICD-10-CM

## 2014-10-18 MED ORDER — GABAPENTIN 300 MG PO CAPS: 300.0000 mg | ORAL_CAPSULE | Freq: Three times a day (TID) | ORAL | Status: DC

## 2014-10-18 NOTE — Patient Instructions (Signed)
May take gabapentin  up to 3 times daily Refer to Dr. Antoine Primas for neck pain Follow up in 2 months.

## 2014-10-18 NOTE — Progress Notes (Signed)
NEUROLOGY CONSULTATION NOTE  Tanya Bush MRN: [id number] DOB: 04-Nov-1975  Referring provider: Blake Divine, MD (ED) Primary care provider: Juluis Rainier, MD  Reason for consult:  Neck and headache  HISTORY OF PRESENT ILLNESS: Tanya Bush is a 39 year old right-handed female with who presents for back, neck and head pain.  History obtained from patient and ED note.  Images of CT of head and neck reviewed.  On the evening of 10/08/14, she developed severe throbbing pain on both sides of the neck, left worse than right, and radiating up to the bilateral posterior and parietal head and down to the upper back.  Moving neck in all directions exacerbates it, but mostly neck turn to left and side-bend to right.  Laying supine or on side in bed caused increased pain.  On 10/10/14, she saw her PCP.  She was prescribed Flexeril  and Naproxen , which have not been too effective.  She presented to the ED on 10/13/14 for worsening pain, as well as numbness and tingling on the left from the jaw line down to the rhomboids.  There was no associated weakness or numbness involving the arm or hand.  CT of head and cervical spine were unremarkable.  She received a Toradol injection and was discharged with Vicodin.  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  Pain has improved but it is still present and causes discomfort.  She also still notes tingling in the neck and into the shoulder.  Flexeril at night is ineffective.  She denies any fall, neck injury or strenuous activity preceding onset of the pain.  She has history of menstrual migraines, but these are different.  She reported an episode of numbness and tingling of the extremities, fatigue, weakness in hands and halitosis in 2007 following the birth of her first child.  She had a workup for MS, including MRIs which were  negative.  PAST MEDICAL HISTORY: Past Medical History  Diagnosis Date  . Headache     PAST SURGICAL HISTORY: Past Surgical History  Procedure Laterality Date  . Cervical biopsy  w/ loop electrode excision    . Thyroid cyst excision      MEDICATIONS: Current Outpatient Prescriptions on File Prior to Visit  Medication Sig Dispense Refill  . HYDROcodone-acetaminophen (NORCO/VICODIN) 5-325 MG per tablet Take 1-2 tablets by mouth every 6 (six) hours as needed. 8 tablet 0  . norethindrone (ORTHO MICRONOR) 0.35 MG tablet Take 1 tablet (0.35 mg total) by mouth daily. (Patient not taking: Reported on 10/18/2014) 1 Package 2   No current facility-administered medications on file prior to visit.    ALLERGIES: No Known Allergies  FAMILY HISTORY: Family History  Problem Relation Age of Onset  . Thyroid disease Father   . Thyroid disease Mother   . Hypertension Father   . Stroke Father   . Osteoarthritis Maternal Grandmother   . Cancer Maternal Grandfather     colon  . Cancer Paternal Grandmother     unknown     SOCIAL HISTORY: Social History   Social History  . Marital Status: Married    Spouse Name: N/A  . Number of Children: N/A  . Years of Education: N/A   Occupational History  . Not on file.   Social History Main Topics  . Smoking status: Former Games developer  . Smokeless tobacco: Never Used  . Alcohol Use: 0.0  oz/week    0 Standard drinks or equivalent per week     Comment: ocassionally weekend  . Drug Use: No  . Sexual Activity:    Partners: Male    Birth Control/ Protection: IUD     Comment: Mirena inserted 08/2005   Other Topics Concern  . Not on file   Social History Narrative    REVIEW OF SYSTEMS: Constitutional: No fevers, chills, or sweats, no generalized fatigue, change in appetite Eyes: No visual changes, double vision, eye pain Ear, nose and throat: No hearing loss, ear pain, nasal congestion, sore throat Cardiovascular: No chest pain,  palpitations Respiratory:  No shortness of breath at rest or with exertion, wheezes GastrointestinaI: No nausea, vomiting, diarrhea, abdominal pain, fecal incontinence Genitourinary:  No dysuria, urinary retention or frequency Musculoskeletal:  Neck pain Integumentary: No rash, pruritus, skin lesions Neurological: as above Psychiatric: No depression, insomnia, anxiety Endocrine: No palpitations, fatigue, diaphoresis, mood swings, change in appetite, change in weight, increased thirst Hematologic/Lymphatic:  No anemia, purpura, petechiae. Allergic/Immunologic: no itchy/runny eyes, nasal congestion, recent allergic reactions, rashes  PHYSICAL EXAM: Filed Vitals:   10/18/14 0855  BP: 118/66  Pulse: 82  Resp: 20   General: No acute distress.  Patient appears well-groomed.  normal body habitus. Head:  Normocephalic/atraumatic Eyes:  fundi unremarkable, without vessel changes, exudates, hemorrhages or papilledema. Neck: supple, bilateral paraspinal tenderness, full range of motion Back: No paraspinal tenderness Heart: regular rate and rhythm Lungs: Clear to auscultation bilaterally. Vascular: No carotid bruits. Neurological Exam: Mental status: alert and oriented to person, place, and time, recent and remote memory intact, fund of knowledge intact, attention and concentration intact, speech fluent and not dysarthric, language intact. Cranial nerves: CN I: not tested CN II: pupils equal, round and reactive to light, visual fields intact, fundi unremarkable, without vessel changes, exudates, hemorrhages or papilledema. CN III, IV, VI:  full range of motion, no nystagmus, no ptosis CN V: facial sensation intact CN VII: upper and lower face symmetric CN VIII: hearing intact CN IX, X: gag intact, uvula midline CN XI: sternocleidomastoid and trapezius muscles intact CN XII: tongue midline Bulk & Tone: normal, no fasciculations. Motor:  5/5 throughout except 5-/5 in right extensor hallucis  longus muscle Sensation:  Pinprick and vibration intact Deep Tendon Reflexes:  3+ in patellars, otherwise 2+ throughout, toes downgoing Finger to nose testing:  intact Heel to shin:  intact Gait:  Normal station and stride.  Able to tandem walk. Romberg negative.  IMPRESSION: Neck pain with posterior headache.  Likely musculoskeletal.  With paresthesias into the left shoulder, there may be a nerve-root component.  She exhibits no focal symptoms, such as focal upper extremity weakness or ptosis, to suggest arterial dissection. History does not suggest this either.  PLAN: 1.  Refer to Dr. Antoine Primas for OMM/PT 2.  Will provide gabapentin if needed. 3.  Follow up in 2 months.  If not improved, consider further testing (NCV-EMG and/or MRI of cervical spine)  Thank you for allowing me to take part in the care of this patient.  Shon Millet, DO  CC:  Juluis Rainier, MD

## 2014-10-21 ENCOUNTER — Ambulatory Visit (INDEPENDENT_AMBULATORY_CARE_PROVIDER_SITE_OTHER): Payer: 59 | Admitting: Family Medicine

## 2014-10-21 ENCOUNTER — Encounter: Payer: Self-pay | Admitting: Family Medicine

## 2014-10-21 VITALS — BP 108/68 | HR 84 | Ht 64.0 in | Wt 145.0 lb

## 2014-10-21 DIAGNOSIS — M9908 Segmental and somatic dysfunction of rib cage: Secondary | ICD-10-CM

## 2014-10-21 DIAGNOSIS — R51 Headache: Secondary | ICD-10-CM | POA: Diagnosis not present

## 2014-10-21 DIAGNOSIS — M9902 Segmental and somatic dysfunction of thoracic region: Secondary | ICD-10-CM | POA: Diagnosis not present

## 2014-10-21 DIAGNOSIS — G4486 Cervicogenic headache: Secondary | ICD-10-CM

## 2014-10-21 DIAGNOSIS — M9901 Segmental and somatic dysfunction of cervical region: Secondary | ICD-10-CM | POA: Diagnosis not present

## 2014-10-21 DIAGNOSIS — M999 Biomechanical lesion, unspecified: Secondary | ICD-10-CM | POA: Insufficient documentation

## 2014-10-21 NOTE — Assessment & Plan Note (Signed)
Patient's does have some several genic headache that I think is secondary to muscle imbalances as well as some of the scapular dyskinesia. 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 Patient did respond very well to osteopathic manipulation that I think will be beneficial as well. We discussed ergonomics at work as well as sleeping position. Discussed over-the-counter natural supplementations. Patient was adamant is different changes as well as keep a headache diary. Patient come back and see me again in 3 weeks for further evaluation and treatment.

## 2014-10-21 NOTE — Patient Instructions (Signed)
Good to see you 2 tennis ball in tube sock and lay them where the head meets the neck.  Memory foam you need at least 14 inches.  Exercises 3 times a week.  Keep arm down when sleeping Exercises 3 times a week.  Tennisball between shoulder blades with sitting.  Monitor at eye level.  Turmeric  twice daily Vitamin D 2000 IU daily Iron 65 elemental grams of iron daily See me again in 3 weeks.   Standing:  Secure a rubber exercise band/tubing so that it is at the height of your shoulders when you are either standing or sitting on a firm arm-less chair.  Grasp an end of the band/tubing in each hand and have your palms face each other. Straighten your elbows and lift your hands straight in front of you at shoulder height. Step back away from the secured end of band/tubing until it becomes tense.  Squeeze your shoulder blades together. Keeping your elbows locked and your hands at shoulder-height, bring your hands out to your side.  Hold __________ seconds. Slowly ease the tension on the band/tubing as you reverse the directions and return to the starting position. Repeat __________ times. Complete this exercise __________ times per day. STRENGTH - Scapular Retractors  Secure a rubber exercise band/tubing so that it is at the height of your shoulders when you are either standing or sitting on a firm arm-less chair.  With a palm-down grip, grasp an end of the band/tubing in each hand. Straighten your elbows and lift your hands straight in front of you at shoulder height. Step back away from the secured end of band/tubing until it becomes tense.  Squeezing your shoulder blades together, draw your elbows back as you bend them. Keep your upper arm lifted away from your body throughout the exercise.  Hold __________ seconds. Slowly ease the tension on the band/tubing as you reverse the directions and return to the starting position. Repeat __________ times. Complete this exercise __________  times per day. STRENGTH - Shoulder Extensors   Secure a rubber exercise band/tubing so that it is at the height of your shoulders when you are either standing or sitting on a firm arm-less chair.  With a thumbs-up grip, grasp an end of the band/tubing in each hand. Straighten your elbows and lift your hands straight in front of you at shoulder height. Step back away from the secured end of band/tubing until it becomes tense.  Squeezing your shoulder blades together, pull your hands down to the sides of your thighs. Do not allow your hands to go behind you.  Hold for __________ seconds. Slowly ease the tension on the band/tubing as you reverse the directions and return to the starting position. Repeat __________ times. Complete this exercise __________ times per day.  STRENGTH - Scapular Retractors and External Rotators  Secure a rubber exercise band/tubing so that it is at the height of your shoulders when you are either standing or sitting on a firm arm-less chair.  With a palm-down grip, grasp an end of the band/tubing in each hand. Bend your elbows 90 degrees and lift your elbows to shoulder height at your sides. Step back away from the secured end of band/tubing until it becomes tense.  Squeezing your shoulder blades together, rotate your shoulder so that your upper arm and elbow remain stationary, but your fists travel upward to head-height.  Hold __________ for seconds. Slowly ease the tension on the band/tubing as you reverse the directions and return to the  starting position. Repeat __________ times. Complete this exercise __________ times per day.  STRENGTH - Scapular Retractors and External Rotators, Rowing  Secure a rubber exercise band/tubing so that it is at the height of your shoulders when you are either standing or sitting on a firm arm-less chair.  With a palm-down grip, grasp an end of the band/tubing in each hand. Straighten your elbows and lift your hands straight in  front of you at shoulder height. Step back away from the secured end of band/tubing until it becomes tense.  Step 1: Squeeze your shoulder blades together. Bending your elbows, draw your hands to your chest as if you are rowing a boat. At the end of this motion, your hands and elbow should be at shoulder-height and your elbows should be out to your sides.  Step 2: Rotate your shoulder to raise your hands above your head. Your forearms should be vertical and your upper-arms should be horizontal.  Hold for __________ seconds. Slowly ease the tension on the band/tubing as you reverse the directions and return to the starting position. Repeat __________ times. Complete this exercise __________ times per day.  STRENGTH - Scapular Retractors and Elevators  Secure a rubber exercise band/tubing so that it is at the height of your shoulders when you are either standing or sitting on a firm arm-less chair.  With a thumbs-up grip, grasp an end of the band/tubing in each hand. Step back away from the secured end of band/tubing until it becomes tense.  Squeezing your shoulder blades together, straighten your elbows and lift your hands straight over your head.  Hold for __________ seconds. Slowly ease the tension on the band/tubing as you reverse the directions and return to the starting position. Repeat __________ times. Complete this exercise __________ times per day.  Document Released: 02/18/2005 Document Revised: 05/13/2011 Document Reviewed: 06/02/2008 [facility] Patient Information 2015 [place], [place]. This information is not intended to replace advice given to you by your health care provider. Make sure you discuss any questions you have with your health care provider.

## 2014-10-21 NOTE — Progress Notes (Signed)
[facility] [address] [place], [place] [address] Phone: [phone] Subjective:    I'm seeing this patient by the request  of:  Gaye Alken, MD jaffe md  CC: Neck pain and headaches  [id number] Tanya Bush is a 39 y.o. female coming in with complaint of neck pain and headaches. Patient proximal wing one week ago started having significant pain and had pain. Patient started actually having the headaches on 10/08/2014. Developed a severe throbbing pain on both sides of the neck that seem to radiate up towards her head to the parietal lobes. Patient states that that it seemed to be significantly worse. Patient states laying down seemed almost increase the pain. Patient's primary care provider and was prescribed Flexeril as well as naproxen. States neither of these were infected. Patient unfortunately on August 11 was seen in the emergency department. CT of the head of the cervical spine were done. These were reviewed by me and shows no significant abnormality. Patient states labs were taken by primary care provider. States that although labs were normal. Does not reamer which ones they are. States that most the pain seems to be on the right side but can go on either side. Denies any radiation down the arms or any numbness or weakness. Denies any visual changes. Patient then went and saw neurology. Patient was given gabapentin and has not notice any significant improvement either. Patient was given Vicodin at the emergency department and this was not helpful either. Continues to have a dull throbbing aching sensation mostly of the back going up to the neck. Continues to have the headache as well. States that it may be somewhat a little bit better from time to time but then can get significantly worse. Makes it difficult to sleep. Rates the severity of 7 out of 10.Marland Kitchen      Past Medical History  Diagnosis Date  . Headache    Past Surgical History    Procedure Laterality Date  . Cervical biopsy  w/ loop electrode excision    . Thyroid cyst excision     Social History  Substance Use Topics  . Smoking status: Former Games developer  . Smokeless tobacco: Never Used  . Alcohol Use: 0.0 oz/week    0 Standard drinks or equivalent per week     Comment: ocassionally weekend   No Known Allergies Family History  Problem Relation Age of Onset  . Thyroid disease Father   . Thyroid disease Mother   . Hypertension Father   . Stroke Father   . Osteoarthritis Maternal Grandmother   . Cancer Maternal Grandfather     colon  . Cancer Paternal Grandmother     unknown      Past medical history, social, surgical and family history all reviewed in electronic medical record.   Review of Systems: No headache, visual changes, nausea, vomiting, diarrhea, constipation, dizziness, abdominal pain, skin rash, fevers, chills, night sweats, weight loss, swollen lymph nodes, body aches, joint swelling, muscle aches, chest pain, shortness of breath, mood changes.   Objective Blood pressure 108/68, pulse 84, height  (1.626 m), weight 145 lb (65.772 kg), SpO2 98 %.  General: No apparent distress alert and oriented x3 mood and affect normal, dressed appropriately.  HEENT: Pupils equal, extraocular movements intact  Respiratory: Patient's speak in full sentences and does not appear short of breath  Cardiovascular: No lower extremity edema, non tender, no erythema  Skin: Warm dry intact with no signs of infection  or rash on extremities or on axial skeleton.  Abdomen: Soft nontender  Neuro: Cranial nerves II through XII are intact, neurovascularly intact in all extremities with 2+ DTRs and 2+ pulses.  Lymph: No lymphadenopathy of posterior or anterior cervical chain or axillae bilaterally.  Gait normal with good balance and coordination.  MSK:  Non tender with full range of motion and good stability and symmetric strength and tone of shoulders, elbows, wrist,  hip, knee and ankles bilaterally.  Neck: Inspection unremarkable. No palpable stepoffs. Negative Spurling's maneuver. Mild limitation left-sided side bending and right-sided rotation. Tender to palpation of the paraspinal musculature on the right cervical spine. No spinous process tenderness Grip strength and sensation normal in bilateral hands Strength good C4 to T1 distribution No sensory change to C4 to T1 Negative Hoffman sign bilaterally Reflexes normal Mild scapular dyskinesia noted on the right side with minimal winging  Osteopathic findings C2 flexed rotated and side bent right C4 flexed rotated and side bent left T1 extended rotated and side bent right with elevated first rib T3 extended rotated and side bent right with inhaled third rib L2 flexed rotated and side bent right   Impression and Recommendations:     This case required medical decision making of moderate complexity.

## 2014-10-21 NOTE — Assessment & Plan Note (Signed)
Decision today to treat with OMT was based on Physical Exam  After verbal consent patient was treated with HVLA, ME, FPR techniques in cervical, thoracic and rib areas  Patient tolerated the procedure well with improvement in symptoms  Patient given exercises, stretches and lifestyle modifications  See medications in patient instructions if given  Patient will follow up in 3 weeks  

## 2014-10-21 NOTE — Progress Notes (Signed)
Pre visit review using our clinic review tool, if applicable. No additional management support is needed unless otherwise documented below in the visit note. 

## 2014-10-24 ENCOUNTER — Ambulatory Visit: Payer: 59 | Admitting: Family Medicine

## 2014-10-26 ENCOUNTER — Telehealth: Payer: Self-pay | Admitting: Family Medicine

## 2014-10-26 ENCOUNTER — Other Ambulatory Visit: Payer: Self-pay | Admitting: *Deleted

## 2014-10-26 DIAGNOSIS — M542 Cervicalgia: Secondary | ICD-10-CM

## 2014-10-26 MED ORDER — HYDROXYZINE HCL 25 MG PO TABS: 25.0000 mg | ORAL_TABLET | Freq: Three times a day (TID) | ORAL | Status: DC | PRN

## 2014-10-26 NOTE — Telephone Encounter (Signed)
Re-sent hydroxyzine to pharmaycy.  Pt is going to find out which physical therapist she wants to go to & call me back so i can enter the referral.

## 2014-10-26 NOTE — Telephone Encounter (Signed)
Her pharmacy is [facility] on [address]  She is wondering if you received her lab work she dropped off on Friday.  She is also wanting a referral for physical therapy and it be written for something generic such as acute neck pain She states she would like to get some relief with ongoing physical therapy and she wanted to pick it up today since she had to miss work today for the pain  Can you please call her to discuss this.

## 2014-10-28 ENCOUNTER — Ambulatory Visit: Payer: 59 | Attending: Family Medicine | Admitting: Physical Therapy

## 2014-10-28 DIAGNOSIS — M6281 Muscle weakness (generalized): Secondary | ICD-10-CM

## 2014-10-28 DIAGNOSIS — M542 Cervicalgia: Secondary | ICD-10-CM | POA: Diagnosis present

## 2014-10-28 DIAGNOSIS — M2560 Stiffness of unspecified joint, not elsewhere classified: Secondary | ICD-10-CM | POA: Insufficient documentation

## 2014-10-28 NOTE — Therapy (Signed)
[facility] [address] [place], [place], [address] Phone: [phone]   Fax:  [phone]  Physical Therapy Evaluation  Patient Details  Name: Tanya Bush MRN: [id number] Date of Birth: 01-03-1976 Referring Provider:  Judi Saa, DO  Encounter Date: 10/28/2014      PT End of Session - 10/28/14 1212    Visit Number 1   Number of Visits 16   Date for PT Re-Evaluation 12/23/14   Authorization Type UMR   PT Start Time 1025   PT Stop Time 1125   PT Time Calculation (min) 60 min   Activity Tolerance Patient tolerated treatment well      Past Medical History  Diagnosis Date  . Headache     Past Surgical History  Procedure Laterality Date  . Cervical biopsy  w/ loop electrode excision    . Thyroid cyst excision      There were no vitals filed for this visit.  Visit Diagnosis:  Acute neck pain - Plan: PT plan of care cert/re-cert  Joint stiffness of spine - Plan: PT plan of care cert/re-cert  Muscle weakness of right upper extremity - Plan: PT plan of care cert/re-cert      Subjective Assessment - 10/28/14 1026    Subjective Started 3 weeks ago.  Started in neck to posterior headache.  No apparent injury.  Had to go to ED one night b/c pain was so bad.   Mild pain today 3/10.  Has even had jaw pain.  Right paraspinals, rhomboids and QL.  Initially slept in recliner.   Worse carrying purse, walking, prolonged sitting   Pertinent History tension HA with menstruation   Limitations Walking;Other (comment)  missed work   Diagnostic tests at ED did CT scan normal;  normal head/neck   Patient Stated Goals alleviate pain   Currently in Pain? Yes   Pain Score 3    Pain Orientation Right  initially more left   Pain Type Acute pain   Aggravating Factors  left side sleeping, prolonged positioning, walking            [facility] PT Assessment - 10/28/14 1035    Assessment   Medical Diagnosis acute neck pain   Onset Date/Surgical Date --  3 weeks ago   Hand Dominance Right   Next MD Visit 11/11/14   Precautions   Precautions None   Restrictions   Weight Bearing Restrictions No   Balance Screen   Has the patient fallen in the past 6 months No   Home Environment   Living Environment Private residence   Type of Home House   Prior Function   Level of Independence Independent   Vocation Part time employment  OT   Observation/Other Assessments   Focus on Therapeutic Outcomes (FOTO)  not captured    Neck Disability Index  54%   Posture/Postural Control   Posture/Postural Control Postural limitations   Posture Comments right scapular winging and increased superior elevation with UE overhead   ROM / Strength   AROM / PROM / Strength AROM;Strength   AROM   AROM Assessment Site Cervical;Lumbar   Cervical Flexion 46   Cervical Extension 60   Cervical - Right Side Bend 40   Cervical - Left Side Bend 35   Cervical - Right Rotation 43   Cervical - Left Rotation 53   Lumbar Flexion 85   Lumbar Extension 30   Lumbar - Right Side Bend 40   Lumbar - Left  Side Bend 43   Strength   Strength Assessment Site Cervical;Shoulder   Right/Left Shoulder --  periscap muscles 4/5   Cervical Flexion 4/5   Cervical Extension 4/5   Palpation   Palpation comment Right tender points rhomboids, B upper traps, levator                   [facility] Adult PT Treatment/Exercise - 10/28/14 1035    Moist Heat Therapy   Number Minutes Moist Heat 15 Minutes   Moist Heat Location Cervical   Electrical Stimulation   Electrical Stimulation Location IFc   Electrical Stimulation Action cervical   Electrical Stimulation Parameters 6 ma   Electrical Stimulation Goals Pain   Manual Therapy   Manual Therapy Soft tissue mobilization   Soft tissue mobilization Bilateral upper traps, suboccipitals, rhomboids, subscapularis          Trigger Point Dry Needling - 10/28/14 1211    Consent Given? Yes   Education  Handout Provided Yes   Muscles Treated Upper Body Upper trapezius;Suboccipitals muscle group;Levator scapulae;Rhomboids   Upper Trapezius Response Twitch reponse elicited;Palpable increased muscle length   SubOccipitals Response Palpable increased muscle length   Levator Scapulae Response Twitch response elicited;Palpable increased muscle length   Rhomboids Response Palpable increased muscle length                PT Short Term Goals - 10/28/14 1221    PT SHORT TERM GOAL #1   Title The patient will have improved cervical flexion to 50 degrees, left sidebending to 40 degrees and right rotation to 45 degrees needed for driving and work activities.   Time 4   Period Weeks   Status New   PT SHORT TERM GOAL #2   Title Patient will report pain and function improved by 25%   Time 4   Period Weeks   Status New   PT SHORT TERM GOAL #3   Title Neck Disability Index improved to 44% indicating improved function with less pain   Time 4   Period Weeks   Status New           PT Long Term Goals - 10/28/14 1223    PT LONG TERM GOAL #1   Title Patient will be independent in HEP and self manangement techniques.   Time 8   Period Weeks   Status New   PT LONG TERM GOAL #2   Title Cervical flexion ROM improved to 55 degrees and cervical sidebending and rotation will be nearly symmetrical for improved mobility with home and work activities.   Time 8   Period Weeks   Status New   PT LONG TERM GOAL #3   Title Right scapular strength and deep cervical muscle strength to 4+/5 needed for lifting at home and work.   Time 8   Period Weeks   Status New   PT LONG TERM GOAL #4   Title Neck Disability Index improved to 38% indicating improved function with less pain   Time 8   Period Weeks   Status New               Plan - 10/28/14 1213    Clinical Impression Statement The patient is a 39 year old female who reports the acute onset of neck/headache pain about 3 weeks ago for no  apparent reason.  The pain further radiated to her right shoulder blade and down to her low back.  No UE symptoms.   Currently her pain  is 3/10 but at one point the pain was so intense she went to the ER.  She has had to miss several days of work.    She has had difficulty sleeping and even slept in the recliner.  She has tender points in bilateral upper traps and right rhomboids.  Decreased cervical lordosis noted.  Suboccipital tightness.  Decreased cervical AROM:  flex 46, ext 60, right sidebend 40, left sidebend 35, right rotation 43, left rotation 53.  Lumbar AROM WFLS.  Decreased deep cervical flexor and extensor strength 4/5.  Right scapular winging and excessive superior elevation with UE elevation.  Scapular strength 4/5.     Pt will benefit from skilled therapeutic intervention in order to improve on the following deficits Pain;Decreased range of motion;Decreased strength;Increased muscle spasms   Rehab Potential Good   PT Frequency 2x / week   PT Duration 8 weeks   PT Treatment/Interventions ADLs/Self Care Home Management;Cryotherapy;Electrical Stimulation;Moist Heat;Ultrasound;Therapeutic activities;Therapeutic exercise;Patient/family education;Manual techniques;Taping;Dry needling   PT Next Visit Plan assess response to dry needling and manual techniques; add supine or prone scapular stabilization exercises; IFC or Hi-volt with heat; taping?         Problem List Patient Active Problem List   Diagnosis Date Noted  . Nonallopathic lesion of cervical region 10/21/2014  . Nonallopathic lesion of thoracic region 10/21/2014  . Nonallopathic lesion-rib cage 10/21/2014  . Cervicogenic headache 10/18/2014  . NECK PAIN 01/02/2007  . PAP SMEAR, ABNORMAL 01/02/2007    Vivien Presto 10/28/2014, 12:31 PM  [facility] [address] [place], [place], [address] Phone: [phone]   Fax:  [phone]   Lavinia Sharps, PT 10/28/2014  12:32 PM Phone: [phone] Fax: [phone]

## 2014-11-04 ENCOUNTER — Ambulatory Visit: Payer: 59 | Attending: Family Medicine | Admitting: Physical Therapy

## 2014-11-04 ENCOUNTER — Encounter: Payer: 59 | Admitting: Physical Therapy

## 2014-11-04 DIAGNOSIS — M6281 Muscle weakness (generalized): Secondary | ICD-10-CM | POA: Diagnosis present

## 2014-11-04 DIAGNOSIS — M542 Cervicalgia: Secondary | ICD-10-CM

## 2014-11-04 DIAGNOSIS — M2560 Stiffness of unspecified joint, not elsewhere classified: Secondary | ICD-10-CM

## 2014-11-04 NOTE — Patient Instructions (Signed)
Handout of abdominal brace series and lumbar multifidi series 5x each daily

## 2014-11-04 NOTE — Therapy (Signed)
[facility] [address] [place], [place], [address] Phone: [phone]   Fax:  [phone]  Physical Therapy Treatment  Patient Details  Name: Tanya Bush MRN: [id number] Date of Birth: 11-23-1975 Referring Provider:  Juluis Rainier, MD  Encounter Date: 11/04/2014      PT End of Session - 11/04/14 1115    Visit Number 2   Number of Visits 16   Date for PT Re-Evaluation 12/23/14   Authorization Type UMR   PT Start Time 0930   PT Stop Time 1025   PT Time Calculation (min) 55 min   Activity Tolerance Patient tolerated treatment well;Patient limited by pain      Past Medical History  Diagnosis Date  . Headache     Past Surgical History  Procedure Laterality Date  . Cervical biopsy  w/ loop electrode excision    . Thyroid cyst excision      There were no vitals filed for this visit.  Visit Diagnosis:  Acute neck pain  Joint stiffness of spine  Muscle weakness of right upper extremity      Subjective Assessment - 11/04/14 0930    Subjective This week it is feeling better.  Today it is lower back more than my neck.  It is the first time in several weeks that I have not missed a day of work.   Currently in Pain? Yes   Pain Score --   Pain Location Thoracic   Pain Orientation Mid   Pain Frequency Intermittent                         [facility] Adult PT Treatment/Exercise - 11/04/14 0001    Lumbar Exercises: Supine   Ab Set 10 reps   Bent Knee Raise 5 reps   Isometric Hip Flexion 5 reps   Shoulder Exercises: Prone   Other Prone Exercises Multifidi series:  pelvic press with HS curls, hip extension 5x each   Other Prone Exercises scapular retraction and shoulder extension with head lift 8x; shoulder HABD with head lift 8x   Moist Heat Therapy   Number Minutes Moist Heat 15 Minutes   Moist Heat Location Cervical;Lumbar Spine   Electrical Stimulation   Electrical Stimulation Location lumbar  only   Electrical Stimulation Action IFC   Electrical Stimulation Parameters 8 ma   Electrical Stimulation Goals Pain   Manual Therapy   Soft tissue mobilization Bilateral upper traps, suboccipitals, rhomboids, subscapularis          Trigger Point Dry Needling - 11/04/14 1113    Consent Given? Yes   Muscles Treated Upper Body Upper trapezius;Rhomboids;Suboccipitals muscle group   Muscles Treated Lower Body --  lumbar multifidi L4-5 level B patient had strong spasm    Upper Trapezius Response Twitch reponse elicited;Palpable increased muscle length   SubOccipitals Response Twitch response elicited;Palpable increased muscle length   Levator Scapulae Response Twitch response elicited;Palpable increased muscle length   Rhomboids Response Palpable increased muscle length  right only      Bilateral except for rhomboids          PT Education - 11/04/14 1114    Education provided Yes   Education Details abdominal brace series and lumbar multifidi series   Person(s) Educated Patient   Methods Explanation;Demonstration;Handout   Comprehension Verbalized understanding;Returned demonstration          PT Short Term Goals - 11/04/14 1123    PT SHORT TERM GOAL #1  Title The patient will have improved cervical flexion to 50 degrees, left sidebending to 40 degrees and right rotation to 45 degrees needed for driving and work activities.   Baseline 11/25/14   Time 4   Period Weeks   Status On-going   PT SHORT TERM GOAL #2   Title Patient will report pain and function improved by 25%   Time 4   Period Weeks   Status On-going   PT SHORT TERM GOAL #3   Title Neck Disability Index improved to 44% indicating improved function with less pain   Time 4   Period Weeks   Status On-going           PT Long Term Goals - 11/04/14 1124    PT LONG TERM GOAL #1   Title Patient will be independent in HEP and self manangement techniques.   Time 8   Period Weeks   Status On-going   PT  LONG TERM GOAL #2   Title Cervical flexion ROM improved to 55 degrees and cervical sidebending and rotation will be nearly symmetrical for improved mobility with home and work activities.   Time 8   Period Weeks   Status On-going   PT LONG TERM GOAL #3   Title Right scapular strength and deep cervical muscle strength to 4+/5 needed for lifting at home and work.   Time 8   Period Weeks   Status On-going   PT LONG TERM GOAL #4   Title Neck Disability Index improved to 38% indicating improved function with less pain   Time 8   Period Weeks   Status On-going               Plan - 11/04/14 1117    Clinical Impression Statement The patient reports that overall her week has been better with less neck pain and scapular pain but has had lower lumbar pain instead.  She did not have to miss work this week for the first time in a while.  She was instructed in abdominal brace series and prone multifidi series with noted difficulty activating multifidi without gluteal activation.  Some asymmetry noted with left activating > right.  Patient reports a positive response to dry needling overall.  Lumbar multifidi dry needling produced marked spasm bilaterally and she was unable to tolerate even "marinating" today.  Decreased tender points noted in right rhomboids and upper traps.  Good relief with dry needling suboccipitals.     PT Next Visit Plan assess response to dry needling and manual techniques;  review multifidi, quadruped exercise; instruct in supine theraband (red) scapular stabilization; taping to lumbar muscles        Problem List Patient Active Problem List   Diagnosis Date Noted  . Nonallopathic lesion of cervical region 10/21/2014  . Nonallopathic lesion of thoracic region 10/21/2014  . Nonallopathic lesion-rib cage 10/21/2014  . Cervicogenic headache 10/18/2014  . NECK PAIN 01/02/2007  . PAP SMEAR, ABNORMAL 01/02/2007    Vivien Presto 11/04/2014, 11:25 AM  [facility] [address] [place], [place], [address] Phone: [phone]   Fax:  [phone]   Lavinia Sharps, PT 11/04/2014 11:26 AM Phone: [phone] Fax: [phone]

## 2014-11-08 ENCOUNTER — Ambulatory Visit: Payer: 59 | Admitting: Physical Therapy

## 2014-11-08 DIAGNOSIS — M6281 Muscle weakness (generalized): Secondary | ICD-10-CM

## 2014-11-08 DIAGNOSIS — M542 Cervicalgia: Secondary | ICD-10-CM | POA: Diagnosis not present

## 2014-11-08 DIAGNOSIS — M2560 Stiffness of unspecified joint, not elsewhere classified: Secondary | ICD-10-CM

## 2014-11-08 NOTE — Therapy (Signed)
[facility] [address] [place], [place], [address] Phone: [phone]   Fax:  [phone]  Physical Therapy Treatment  Patient Details  Name: Tanya Bush MRN: [id number] Date of Birth: 04-13-1975 Referring Provider:  Juluis Rainier, MD  Encounter Date: 11/08/2014      PT End of Session - 11/08/14 1529    Visit Number 3   Number of Visits 16   Date for PT Re-Evaluation 12/23/14   Authorization Type UMR   PT Start Time 1500   PT Stop Time 1545   PT Time Calculation (min) 45 min   Activity Tolerance Patient tolerated treatment well      Past Medical History  Diagnosis Date  . Headache     Past Surgical History  Procedure Laterality Date  . Cervical biopsy  w/ loop electrode excision    . Thyroid cyst excision      There were no vitals filed for this visit.  Visit Diagnosis:  Acute neck pain  Joint stiffness of spine  Muscle weakness of right upper extremity      Subjective Assessment - 11/08/14 1458    Subjective Only LBP 2/10.  Some trigger points in upper traps.  Drove to [place] and did OK.  Some numbness across the low back.  No LE symptoms.     Currently in Pain? Yes   Pain Score 2    Pain Location Back   Pain Orientation Right;Left   Aggravating Factors  transient;                           [facility] Adult PT Treatment/Exercise - 11/08/14 1520    Lumbar Exercises: Supine   Other Supine Lumbar Exercises ab set with single leg lowering 5x   Shoulder Exercises: Prone   Other Prone Exercises Multifidi series:  pelvic press with HS curls, hip extension 5x each   Other Prone Exercises child's pose   Moist Heat Therapy   Number Minutes Moist Heat 15 Minutes   Moist Heat Location Cervical;Lumbar Spine   Electrical Stimulation   Electrical Stimulation Location lumbar only   Electrical Stimulation Action IFC   Electrical Stimulation Parameters 7 ma   Electrical Stimulation Goals Pain   Manual Therapy   Manual Therapy Taping   Kinesiotex Inhibit Muscle   Kinesiotix   Inhibit Muscle  B lumbar multifidi                  PT Short Term Goals - 11/08/14 1549    PT SHORT TERM GOAL #1   Title The patient will have improved cervical flexion to 50 degrees, left sidebending to 40 degrees and right rotation to 45 degrees needed for driving and work activities.   Time 4   Period Weeks   Status On-going   PT SHORT TERM GOAL #2   Title Patient will report pain and function improved by 25%   Time 4   Period Weeks   Status Achieved   PT SHORT TERM GOAL #3   Title Neck Disability Index improved to 44% indicating improved function with less pain   Time 4   Period Weeks   Status On-going           PT Long Term Goals - 11/08/14 1550    PT LONG TERM GOAL #1   Title Patient will be independent in HEP and self manangement techniques.   Time 8   Period Weeks  Status On-going   PT LONG TERM GOAL #2   Title Cervical flexion ROM improved to 55 degrees and cervical sidebending and rotation will be nearly symmetrical for improved mobility with home and work activities.   Time 8   Period Weeks   Status On-going   PT LONG TERM GOAL #3   Title Right scapular strength and deep cervical muscle strength to 4+/5 needed for lifting at home and work.   Time 8   Period Weeks   Status On-going   PT LONG TERM GOAL #4   Title Neck Disability Index improved to 38% indicating improved function with less pain   Time 8   Period Weeks   Status On-going               Plan - 11/08/14 1530    Clinical Impression Statement Patient states she is feeling much better.  No neck or periscapular pain today.  Only discomfort is across low back 2/10.  States she feels the best with neutral spine or slight posterior pelvic tilt.  Hyperextension and full flexion would aggravate.  Improved activation of lumbar multifidi today with decreased gluteal activation.  Good transverse abdominal  activation.  Discussed appropriate execises for this stage in her recovery:  supine scap stab, quadruped, childs pose, prone neutral.  Patient may try LE exerices on physioball.  Hold on dry needling today but may need next visit if myofascial issues return.     PT Next Visit Plan assess response to kinesiotaping to lumbar paraspinals.  resume dry needling and manual techniques as needed for cervical, periscapular and lumbar myofascial pain. review HEP as needed; recheck cervical AROM        Problem List Patient Active Problem List   Diagnosis Date Noted  . Nonallopathic lesion of cervical region 10/21/2014  . Nonallopathic lesion of thoracic region 10/21/2014  . Nonallopathic lesion-rib cage 10/21/2014  . Cervicogenic headache 10/18/2014  . NECK PAIN 01/02/2007  . PAP SMEAR, ABNORMAL 01/02/2007    Vivien Presto 11/08/2014, 3:51 PM  [facility] [address] [place], [place], [address] Phone: [phone]   Fax:  [phone]  Lavinia Sharps, PT 11/08/2014 3:52 PM Phone: [phone] Fax: [phone]

## 2014-11-11 ENCOUNTER — Encounter: Payer: Self-pay | Admitting: Family Medicine

## 2014-11-11 ENCOUNTER — Ambulatory Visit (INDEPENDENT_AMBULATORY_CARE_PROVIDER_SITE_OTHER): Payer: 59 | Admitting: Family Medicine

## 2014-11-11 VITALS — BP 108/76 | HR 86 | Wt 144.0 lb

## 2014-11-11 DIAGNOSIS — M9908 Segmental and somatic dysfunction of rib cage: Secondary | ICD-10-CM

## 2014-11-11 DIAGNOSIS — G4486 Cervicogenic headache: Secondary | ICD-10-CM

## 2014-11-11 DIAGNOSIS — M9901 Segmental and somatic dysfunction of cervical region: Secondary | ICD-10-CM | POA: Diagnosis not present

## 2014-11-11 DIAGNOSIS — R51 Headache: Secondary | ICD-10-CM

## 2014-11-11 DIAGNOSIS — M9902 Segmental and somatic dysfunction of thoracic region: Secondary | ICD-10-CM | POA: Diagnosis not present

## 2014-11-11 DIAGNOSIS — M999 Biomechanical lesion, unspecified: Secondary | ICD-10-CM

## 2014-11-11 NOTE — Progress Notes (Signed)
[facility] [address] [place], [place] [address] Phone: [phone] Subjective:    I'm seeing this patient by the request  of:  Gaye Alken, MD   CC: Neck pain and headaches  [id number] PERINA SALVAGGIO is a 39 y.o. female coming in with complaint of neck pain and headaches. Patient has been doing physical therapy, and taking home exercises, and only taking a muscle relaxer occasionally and states that she is 90% better. No longer having any headaches. Some mild stiffness in the neck. Notices she watches her posture throughout the day she does very well. No new symptoms such as radiation down the arm or any numbness or tingling.    Past Medical History  Diagnosis Date  . Headache    Past Surgical History  Procedure Laterality Date  . Cervical biopsy  w/ loop electrode excision    . Thyroid cyst excision     Social History  Substance Use Topics  . Smoking status: Former Games developer  . Smokeless tobacco: Never Used  . Alcohol Use: 0.0 oz/week    0 Standard drinks or equivalent per week     Comment: ocassionally weekend   No Known Allergies Family History  Problem Relation Age of Onset  . Thyroid disease Father   . Thyroid disease Mother   . Hypertension Father   . Stroke Father   . Osteoarthritis Maternal Grandmother   . Cancer Maternal Grandfather     colon  . Cancer Paternal Grandmother     unknown      Past medical history, social, surgical and family history all reviewed in electronic medical record.   Review of Systems: No headache, visual changes, nausea, vomiting, diarrhea, constipation, dizziness, abdominal pain, skin rash, fevers, chills, night sweats, weight loss, swollen lymph nodes, body aches, joint swelling, muscle aches, chest pain, shortness of breath, mood changes.   Objective Blood pressure 108/76, pulse 86, weight 144 lb (65.318 kg), SpO2 98 %.  General: No apparent distress alert and oriented x3 mood and  affect normal, dressed appropriately.  HEENT: Pupils equal, extraocular movements intact  Respiratory: Patient's speak in full sentences and does not appear short of breath  Cardiovascular: No lower extremity edema, non tender, no erythema  Skin: Warm dry intact with no signs of infection or rash on extremities or on axial skeleton.  Abdomen: Soft nontender  Neuro: Cranial nerves II through XII are intact, neurovascularly intact in all extremities with 2+ DTRs and 2+ pulses.  Lymph: No lymphadenopathy of posterior or anterior cervical chain or axillae bilaterally.  Gait normal with good balance and coordination.  MSK:  Non tender with full range of motion and good stability and symmetric strength and tone of shoulders, elbows, wrist, hip, knee and ankles bilaterally.  Neck: Inspection unremarkable. No palpable stepoffs. Negative Spurling's maneuver. Full range of motion with very minimal tenderness of the paraspinal musculature of the cervical spine Grip strength and sensation normal in bilateral hands Strength good C4 to T1 distribution No sensory change to C4 to T1 Negative Hoffman sign bilaterally Reflexes normal Mild improvement in the scapular dyskinesia  Osteopathic findings C2 flexed rotated and side bent right C4 flexed rotated and side bent left T1 extended rotated and side bent right with elevated first rib T3 extended rotated and side bent right with inhaled third rib L2 flexed rotated and side bent right Sacrum left on left   Impression and Recommendations:     This case required medical decision making  of moderate complexity.

## 2014-11-11 NOTE — Patient Instructions (Addendum)
I am so glad you are doing well.  Conitnue the posture exercises for sure For low back focus on hip abductors .Exercises on wall.  Heel and butt touching.  Raise leg 6 inches and hold 2 seconds.  Down slow for count of 4 seconds.  1 set of 30 reps daily on both sides.  Continue what yo [place] doing.  See me again in 4-6 weeks.

## 2014-11-11 NOTE — Assessment & Plan Note (Addendum)
Significantly better at this time. Discussed icing regimen and home exercises. Given phase II postural changes and different ergonomic changes that could be beneficial. Patient did respond well to osteopathic manipulation and we will change the interval to 4-6 week intervals.

## 2014-11-11 NOTE — Assessment & Plan Note (Signed)
Decision today to treat with OMT was based on Physical Exam  After verbal consent patient was treated with HVLA, ME, FPR techniques in cervical, thoracic and rib areas  Patient tolerated the procedure well with improvement in symptoms  Patient given exercises, stretches and lifestyle modifications  See medications in patient instructions if given  Patient will follow up in 4-6 weeks    

## 2014-11-14 ENCOUNTER — Other Ambulatory Visit: Payer: Self-pay | Admitting: Family Medicine

## 2014-11-14 DIAGNOSIS — E049 Nontoxic goiter, unspecified: Secondary | ICD-10-CM

## 2014-11-15 ENCOUNTER — Ambulatory Visit: Payer: 59 | Admitting: Physical Therapy

## 2014-11-15 DIAGNOSIS — M2560 Stiffness of unspecified joint, not elsewhere classified: Secondary | ICD-10-CM

## 2014-11-15 DIAGNOSIS — M542 Cervicalgia: Secondary | ICD-10-CM | POA: Diagnosis not present

## 2014-11-15 DIAGNOSIS — M6281 Muscle weakness (generalized): Secondary | ICD-10-CM

## 2014-11-15 NOTE — Therapy (Signed)
[place], [place], [address] Phone: [phone]   Fax:  [phone]  Physical Therapy Treatment  Patient Details  Name: Tanya Bush MRN: [id number] Date of Birth: [birth year] Referring Provider:  Leighton Ruff, MD  Encounter Date: 11/15/2014      PT End of Session - 11/15/14 1550    Visit Number 4   Number of Visits 16   Date for PT Re-Evaluation 12/23/14   Authorization Type UMR   PT Start Time 1504   PT Stop Time 1555   PT Time Calculation (min) 51 min   Activity Tolerance Patient tolerated treatment well      Past Medical History  Diagnosis Date  . Headache     Past Surgical History  Procedure Laterality Date  . Cervical biopsy  w/ loop electrode excision    . Thyroid cyst excision      There were no vitals filed for this visit.  Visit Diagnosis:  Acute neck pain  Joint stiffness of spine  Muscle weakness of right upper extremity      Subjective Assessment - 11/15/14 1505    Subjective Feeling good.  Saw Dr. Tamala Julian and he did some joint manipulation which temporarily increased pain.  Neck doing well.   Currently in Pain? No/denies   Pain Score 0-No pain   Pain Orientation Right;Left   Pain Type Acute pain   Pain Frequency Intermittent            [facility] PT Assessment - 11/15/14 1506    AROM   Cervical Flexion 65   Cervical Extension 85   Cervical - Right Side Bend 50   Cervical - Left Side Bend 40   Cervical - Right Rotation 55   Cervical - Left Rotation 57   Lumbar Flexion 80   Lumbar Extension 30   Lumbar - Right Side Bend 47   Lumbar - Left Side Bend 44                     [facility] Adult PT Treatment/Exercise - 11/15/14 1544    Shoulder Exercises: Prone   Extension AROM;Right;10 reps   Horizontal ABduction 1 AROM;Right;10 reps   Horizontal ABduction 2 AROM;Strengthening;Right;10 reps  Y formation   Other Prone Exercises Review of HEP   Moist  Heat Therapy   Number Minutes Moist Heat 15 Minutes   Moist Heat Location Cervical;Lumbar Spine   Manual Therapy   Manual Therapy Soft tissue mobilization;Joint mobilization;Manual Traction   Joint Mobilization Cervical P-A grade 3 R/L; lateral R/L grade 3 10x   Soft tissue mobilization Bilateral upper traps, suboccipitals, rhomboids, subscapularis   Manual Traction 5x 20 sec          Trigger Point Dry Needling - 11/15/14 1548    Consent Given? Yes   Muscles Treated Upper Body Upper trapezius;Suboccipitals muscle group;Levator scapulae   Muscles Treated Lower Body --  bilateral superficial paraspinals   Upper Trapezius Response Twitch reponse elicited;Palpable increased muscle length   SubOccipitals Response Twitch response elicited;Palpable increased muscle length   Levator Scapulae Response Twitch response elicited;Palpable increased muscle length      Performed bilaterally          PT Short Term Goals - 11/15/14 1600    PT SHORT TERM GOAL #1   Title The patient will have improved cervical flexion to 50 degrees, left sidebending to 40 degrees and right rotation to 45 degrees needed for driving and work activities.  Status Achieved   PT SHORT TERM GOAL #2   Title Patient will report pain and function improved by 25%   Status Achieved   PT SHORT TERM GOAL #3   Title Neck Disability Index improved to 44% indicating improved function with less pain   Time 4   Period Weeks   Status On-going           PT Long Term Goals - 11/15/14 1600    PT LONG TERM GOAL #1   Title Patient will be independent in HEP and self manangement techniques.   Time 8   Period Weeks   Status On-going   PT LONG TERM GOAL #2   Title Cervical flexion ROM improved to 55 degrees and cervical sidebending and rotation will be nearly symmetrical for improved mobility with home and work activities.   Time 8   Period Weeks   Status Partially Met   PT LONG TERM GOAL #3   Title Right scapular  strength and deep cervical muscle strength to 4+/5 needed for lifting at home and work.   Time 8   Period Weeks   Status On-going   PT LONG TERM GOAL #4   Title Neck Disability Index improved to 38% indicating improved function with less pain   Time 8   Period Weeks   Status On-going               Plan - 11/15/14 1553    Clinical Impression Statement Excellent improvements in cervical AROM, lumbar AROM and pain acuity.  Decreased trigger point size and number in cervical and lumbar musculature.  Right scapula slightly more protracted than left, discussed retraction and depression with right single arm Is, Ys, Ts.  Patient is sensitive to deep lumbar multifidi dry needling and joint manipulation, so lumbar dry needling performed in this region more superficially and mobilizations performed at low intensity (grade 3).  Overall, progressing with goals and may only need a few more PT visits.  She declines the need for e-stim today.   PT Next Visit Plan dry needling lumbar and cervical as needed; other manual therapy as needed;  periscapular/lumbar strengthening; deep cervical flexor strengthening        Problem List Patient Active Problem List   Diagnosis Date Noted  . Nonallopathic lesion of cervical region 10/21/2014  . Nonallopathic lesion of thoracic region 10/21/2014  . Nonallopathic lesion-rib cage 10/21/2014  . Cervicogenic headache 10/18/2014  . NECK PAIN 01/02/2007  . PAP SMEAR, ABNORMAL 01/02/2007    [name] 11/15/2014, 4:09 PM  [facility] [address] [place], [place], [address] Phone: [phone]   Fax:  [phone]    Ruben Im, PT 11/15/2014 4:10 PM Phone: [phone] Fax: [phone]

## 2014-11-22 ENCOUNTER — Ambulatory Visit: Payer: 59 | Admitting: Physical Therapy

## 2014-11-22 DIAGNOSIS — M542 Cervicalgia: Secondary | ICD-10-CM | POA: Diagnosis not present

## 2014-11-22 DIAGNOSIS — M2560 Stiffness of unspecified joint, not elsewhere classified: Secondary | ICD-10-CM

## 2014-11-22 DIAGNOSIS — M6281 Muscle weakness (generalized): Secondary | ICD-10-CM

## 2014-11-22 NOTE — Therapy (Signed)
[place], [place], [address] Phone: [phone]   Fax:  [phone]  Physical Therapy Treatment  Patient Details  Name: Tanya Bush MRN: [id number] Date of Birth: [birth year] Referring Provider:  Leighton Ruff, MD  Encounter Date: 11/22/2014      PT End of Session - 11/22/14 1711    Visit Number 5   Number of Visits 16   Date for PT Re-Evaluation 12/23/14   Authorization Type UMR   PT Start Time 0300   PT Stop Time 0402   PT Time Calculation (min) 62 min   Activity Tolerance Patient limited by pain;Other (comment)  Pt tolerated most but hypersensitive to T3   Behavior During Therapy [facility] for tasks assessed/performed      Past Medical History  Diagnosis Date  . Headache     Past Surgical History  Procedure Laterality Date  . Cervical biopsy  w/ loop electrode excision    . Thyroid cyst excision      There were no vitals filed for this visit.  Visit Diagnosis:  Acute neck pain  Joint stiffness of spine  Muscle weakness of right upper extremity      Subjective Assessment - 11/22/14 1501    Subjective I have been doing well, but last Thursday night I started having additional pain with being out of town and sleeping in different beds and doing excessive walking   Pertinent History tension HA with menstruation   Limitations Walking;Other (comment)   Patient Stated Goals alleviate pain   Currently in Pain? Yes   Pain Score 3    Pain Location Back  Thoracic    Pain Orientation Right;Left   Pain Descriptors / Indicators Aching;Restless;Tightness   Pain Type Acute pain   Pain Onset 1 to 4 weeks ago   Pain Frequency Intermittent   Multiple Pain Sites Yes   Pain Score 5   Pain Location Neck  pectoralis as well   Pain Orientation Right;Left   Pain Descriptors / Indicators Aching;Tightness;Sore   Pain Type Acute pain   Pain Onset 1 to 4 weeks ago            [facility] PT  Assessment - 11/22/14 1500    Observation/Other Assessments   Observations Pt with Right elevated pelvis and shortened QL   Palpation   Palpation comment Pt with multiple tender trigger points but most marked was R subscapularis, R/L subocciput and especially erector spinae of T3                     [facility] Adult PT Treatment/Exercise - 11/22/14 1707    Neck Exercises: Supine   Other Supine Exercise Deep neck flexor with towel roll support 10 x VC for technique 5 sec hold   Moist Heat Therapy   Number Minutes Moist Heat 15 Minutes   Moist Heat Location Cervical;Lumbar Spine   Electrical Stimulation   Electrical Stimulation Location upper trap and neck   Electrical Stimulation Action IFC   Electrical Stimulation Parameters 6 ma for 15 min   Electrical Stimulation Goals Pain   Manual Therapy   Manual Therapy Soft tissue mobilization;Joint mobilization;Manual Traction   Joint Mobilization Cervical P-A grade 3 R/L; lateral R/L grade 3 10x. T-3 PA mob with marked tenderness   Soft tissue mobilization Bilateral upper traps, suboccipitals, rhomboids, subscapularis   Manual Traction Soft tissue for pectoralis minor as well   Neck Exercises: Stretches   Upper Trapezius Stretch 2 reps;30  seconds  reviewed   Levator Stretch 2 reps;30 seconds  reviewed          Trigger Point Dry Needling - 11/22/14 1507    Consent Given? Yes   Education Handout Provided No  previously given   Muscles Treated Upper Body Upper trapezius;Rhomboids;Levator scapulae;Suboccipitals muscle group;Subscapularis  Right quadratus lumborum, C-3 to C6 erector spinae    Muscles Treated Lower Body Gluteus maximus  bilateral superficial paraspinals ,multifidus as well deep   Upper Trapezius Response Twitch reponse elicited;Palpable increased muscle length  T-3 very tender and marked reaction to dry needling   SubOccipitals Response Twitch response elicited  bil   Pectoralis Minor Response Twitch response  elicited;Palpable increased muscle length   Levator Scapulae Response Twitch response elicited;Palpable increased muscle length  bil   Rhomboids Response Palpable increased muscle length  right only   Subscapularis Response Twitch response elicited;Palpable increased muscle length  Right only   Gluteus Maximus Response Twitch response elicited;Palpable increased muscle length  R glut near R PSIS twitch response  QL R twitch response              PT Education - 11/22/14 1710    Education provided Yes   Education Details Deep neck flexor and review of upper trap and levator stretch   Person(s) Educated Patient   Methods Explanation;Demonstration;Handout;Verbal cues   Comprehension Verbalized understanding;Returned demonstration          PT Short Term Goals - 11/15/14 1600    PT SHORT TERM GOAL #1   Title The patient will have improved cervical flexion to 50 degrees, left sidebending to 40 degrees and right rotation to 45 degrees needed for driving and work activities.   Status Achieved   PT SHORT TERM GOAL #2   Title Patient will report pain and function improved by 25%   Status Achieved   PT SHORT TERM GOAL #3   Title Neck Disability Index improved to 44% indicating improved function with less pain   Time 4   Period Weeks   Status On-going           PT Long Term Goals - 11/15/14 1600    PT LONG TERM GOAL #1   Title Patient will be independent in HEP and self manangement techniques.   Time 8   Period Weeks   Status On-going   PT LONG TERM GOAL #2   Title Cervical flexion ROM improved to 55 degrees and cervical sidebending and rotation will be nearly symmetrical for improved mobility with home and work activities.   Time 8   Period Weeks   Status Partially Met   PT LONG TERM GOAL #3   Title Right scapular strength and deep cervical muscle strength to 4+/5 needed for lifting at home and work.   Time 8   Period Weeks   Status On-going   PT LONG TERM GOAL #4    Title Neck Disability Index improved to 38% indicating improved function with less pain   Time 8   Period Weeks   Status On-going               Plan - 11/22/14 1712    Clinical Impression Statement Pt with increased pain 5/10 since taking anniversary trip. More difficulty with sleeping in R scapular area pain.  Pt seems to move around with increasing sensitivity.  Pt concerned that T-3 area was initially swollen and now is very tender to all  touch and modalities.  Since pt was much  more hypersensitve today, today's treatment concentrated on pain relief.  Pt does need to begin more Deep neck flexor strengthening  but  change of bedding and excessive walking/hikikng  exacerbated symptoms.   PT Next Visit Plan Assess dry needling benefit. other manual therapy as needed.  Scapular and lumbar strengthening next visit.  Dry needlle areas that are most  needed, Assess goals next visit        Problem List Patient Active Problem List   Diagnosis Date Noted  . Nonallopathic lesion of cervical region 10/21/2014  . Nonallopathic lesion of thoracic region 10/21/2014  . Nonallopathic lesion-rib cage 10/21/2014  . Cervicogenic headache 10/18/2014  . NECK PAIN 01/02/2007  . PAP SMEAR, ABNORMAL 01/02/2007    Voncille Lo, PT 11/22/2014 5:27 PM Phone: [phone] Fax: [facility] [place], [place], [address] Phone: [phone]   Fax:  [phone]

## 2014-11-25 ENCOUNTER — Ambulatory Visit: Payer: 59 | Admitting: Physical Therapy

## 2014-11-25 DIAGNOSIS — M542 Cervicalgia: Secondary | ICD-10-CM | POA: Diagnosis not present

## 2014-11-25 DIAGNOSIS — M6281 Muscle weakness (generalized): Secondary | ICD-10-CM

## 2014-11-25 DIAGNOSIS — M2560 Stiffness of unspecified joint, not elsewhere classified: Secondary | ICD-10-CM

## 2014-11-25 NOTE — Therapy (Signed)
[place], [place], [address] Phone: [phone]   Fax:  [phone]  Physical Therapy Treatment  Patient Details  Name: Tanya Bush MRN: [id number] Date of Birth: 10-11-1975 Referring Provider:  Leighton Ruff, MD  Encounter Date: 11/25/2014      PT End of Session - 11/25/14 0938    Visit Number 6   Number of Visits 16   Date for PT Re-Evaluation 12/23/14   Authorization Type UMR   PT Start Time 0858   PT Stop Time 0949   PT Time Calculation (min) 51 min   Activity Tolerance Patient tolerated treatment well      Past Medical History  Diagnosis Date  . Headache     Past Surgical History  Procedure Laterality Date  . Cervical biopsy  w/ loop electrode excision    . Thyroid cyst excision      There were no vitals filed for this visit.  Visit Diagnosis:  Acute neck pain  Joint stiffness of spine  Muscle weakness of right upper extremity      Subjective Assessment - 11/25/14 0858    Subjective Flare up last Thursday night whole back.  Dry needling was really painful.  I was really hurting even the next day.  I feel like the source in T3 area.  Patient expresses frustration over her recent exacerbation.   Currently in Pain? Yes   Pain Score 6    Pain Location Neck   Pain Orientation Right;Left   Pain Type Acute pain   Pain Onset More than a month ago   Pain Frequency Intermittent   Aggravating Factors  lifting stuff at the [employer];  riding in the car to [place] and doing a lot of walking   Pain Relieving Factors e-stim/heat                         [facility] Adult PT Treatment/Exercise - 11/25/14 0959    Moist Heat Therapy   Number Minutes Moist Heat 15 Minutes   Moist Heat Location Cervical;Lumbar Spine   Electrical Stimulation   Electrical Stimulation Location cervical and thoracic   Electrical Stimulation Action Pre-mod   Electrical Stimulation  Parameters 7 ma   Electrical Stimulation Goals Pain   Manual Therapy   Manual Therapy Myofascial release;Manual Traction;Scapular mobilization   Myofascial Release Graston instrument assisted myofascial G4 sweep and fan to cervical and periscapular muscles in prone, sidelying and supine   Scapular Mobilization Using Graston G4   Manual Traction Using Graston G4 instrument suboccipital release     Bilaterally           PT Education - 11/25/14 1004    Education provided Yes   Education Details given info on massage therapist;  discussed self care during this flare up   Person(s) Educated Patient   Methods Explanation   Comprehension Verbalized understanding          PT Short Term Goals - 11/25/14 1108    PT SHORT TERM GOAL #1   Title The patient will have improved cervical flexion to 50 degrees, left sidebending to 40 degrees and right rotation to 45 degrees needed for driving and work activities.   Status Achieved   PT SHORT TERM GOAL #2   Title Patient will report pain and function improved by 25%   Status Achieved   PT SHORT TERM GOAL #3   Title Neck Disability Index improved to 44% indicating  improved function with less pain   Time 4   Period Weeks   Status On-going           PT Long Term Goals - 11/25/14 1109    PT LONG TERM GOAL #1   Title Patient will be independent in HEP and self manangement techniques.   Time 8   Period Weeks   Status On-going   PT LONG TERM GOAL #2   Title Cervical flexion ROM improved to 55 degrees and cervical sidebending and rotation will be nearly symmetrical for improved mobility with home and work activities.   Time 8   Period Weeks   Status Partially Met   PT LONG TERM GOAL #3   Title Right scapular strength and deep cervical muscle strength to 4+/5 needed for lifting at home and work.   Time 8   Period Weeks   Status On-going   PT LONG TERM GOAL #4   Title Neck Disability Index improved to 38% indicating improved  function with less pain   Time 8   Period Weeks   Status On-going               Plan - 11/25/14 1105    Clinical Impression Statement Patient still in an exacerbation of upper thoracic and lower cervical region and therefore no new progress toward goals.   She defers dry needling today but responded well to Graston technique for periscapular muscles and cervical musculature.  Therapist closely monitoring pain response and modifying as needed.  Hold on ther ex today secondary to pain severity (patient tearful).     PT Next Visit Plan Assess response to Graston technique.  Resume periscapular and deep cervical flexor strengthening as tolerated; e-stim/heat as needed.          Problem List Patient Active Problem List   Diagnosis Date Noted  . Nonallopathic lesion of cervical region 10/21/2014  . Nonallopathic lesion of thoracic region 10/21/2014  . Nonallopathic lesion-rib cage 10/21/2014  . Cervicogenic headache 10/18/2014  . NECK PAIN 01/02/2007  . PAP SMEAR, ABNORMAL 01/02/2007    [name] 11/25/2014, 11:23 AM  [facility] [address] [place], [place], [address] Phone: [phone]   Fax:  [phone]  Ruben Im, PT 11/25/2014 11:23 AM Phone: [phone] Fax: [phone]

## 2014-11-29 ENCOUNTER — Ambulatory Visit: Payer: 59 | Admitting: Physical Therapy

## 2014-11-29 DIAGNOSIS — M542 Cervicalgia: Secondary | ICD-10-CM | POA: Diagnosis not present

## 2014-11-29 DIAGNOSIS — M6281 Muscle weakness (generalized): Secondary | ICD-10-CM

## 2014-11-29 DIAGNOSIS — M2560 Stiffness of unspecified joint, not elsewhere classified: Secondary | ICD-10-CM

## 2014-11-29 NOTE — Therapy (Signed)
[place], [place], [address] Phone: [phone]   Fax:  [phone]  Physical Therapy Treatment  Patient Details  Name: Tanya Bush MRN: [id number] Date of Birth: Jul 13, 1975 Referring Provider:  Leighton Ruff, MD  Encounter Date: 11/29/2014      PT End of Session - 11/29/14 1606    Visit Number 7   Number of Visits 16   Date for PT Re-Evaluation 12/23/14   Authorization Type UMR   PT Start Time 1510   PT Stop Time 1615   PT Time Calculation (min) 65 min   Activity Tolerance Patient tolerated treatment well      Past Medical History  Diagnosis Date  . Headache     Past Surgical History  Procedure Laterality Date  . Cervical biopsy  w/ loop electrode excision    . Thyroid cyst excision      There were no vitals filed for this visit.  Visit Diagnosis:  Acute neck pain  Joint stiffness of spine  Muscle weakness of right upper extremity      Subjective Assessment - 11/29/14 1509    Subjective (p) Today is so-so.  Good over the weekend.  Neck > back   Currently in Pain? (p) Yes   Pain Score (p) 5    Pain Location (p) Neck   Pain Orientation (p) Right;Left   Pain Onset (p) More than a month ago   Pain Frequency (p) Intermittent   Pain Location (p) Back   Pain Orientation (p) Right;Left;Mid   Pain Type (p) Chronic pain   Pain Onset (p) More than a month ago   Pain Frequency (p) Intermittent                         [facility] Adult PT Treatment/Exercise - 11/29/14 1604    Moist Heat Therapy   Number Minutes Moist Heat 15 Minutes   Moist Heat Location Cervical   Electrical Stimulation   Electrical Stimulation Location cervical   Electrical Stimulation Action IFC   Electrical Stimulation Parameters 8 ma   Electrical Stimulation Goals Pain   Manual Therapy   Manual Therapy Soft tissue mobilization;Myofascial release;Scapular mobilization;Manual Traction   Soft  tissue mobilization cervical musculature   Myofascial Release Graston instrument assisted G4 bilateral lumbar paraspinals upper traps, rhomboids, subscapularis   Scapular Mobilization Graston assisted    Manual Traction Graston instrument assisted 3x 90 sec       Review of deep cervical flexors 10x, and supine retractions 10x.      Trigger Point Dry Needling - 11/29/14 1606    Consent Given? Yes   Muscles Treated Upper Body Upper trapezius;Suboccipitals muscle group;Levator scapulae   Upper Trapezius Response Twitch reponse elicited;Palpable increased muscle length   SubOccipitals Response Twitch response elicited;Palpable increased muscle length   Levator Scapulae Response Twitch response elicited;Palpable increased muscle length     Performed bilaterally.           PT Short Term Goals - 11/29/14 1610    PT SHORT TERM GOAL #1   Title The patient will have improved cervical flexion to 50 degrees, left sidebending to 40 degrees and right rotation to 45 degrees needed for driving and work activities.   Status Achieved   PT SHORT TERM GOAL #2   Title Patient will report pain and function improved by 25%   Status Achieved   PT SHORT TERM GOAL #3   Title Neck Disability  Index improved to 44% indicating improved function with less pain   Time 4   Period Weeks   Status On-going           PT Long Term Goals - 11/29/14 1610    PT LONG TERM GOAL #1   Title Patient will be independent in HEP and self manangement techniques.   Time 8   Period Weeks   Status On-going   PT LONG TERM GOAL #2   Title Cervical flexion ROM improved to 55 degrees and cervical sidebending and rotation will be nearly symmetrical for improved mobility with home and work activities.   Time 8   Period Weeks   Status Partially Met   PT LONG TERM GOAL #3   Title Right scapular strength and deep cervical muscle strength to 4+/5 needed for lifting at home and work.   Time 8   Period Weeks   Status  On-going   PT LONG TERM GOAL #4   Title Neck Disability Index improved to 38% indicating improved function with less pain   Period Weeks   Status On-going               Plan - 11/29/14 1607    Clinical Impression Statement The patient continues to have variable pain in neck, interscapular muscles and bilateral lumbar paraspinals.  She is able to tolerate dry needling in limited amounts in more superficial musculature but less tolerant to deep muscular needling.  Graston instrument assisted myofascial work improved muscle lengths and tolerated well pain wise.  Therapist closely monitoring response throughout.  Good pain relief with e-stim/heat.     PT Next Visit Plan Assess response to Graston technique.  Resume periscapular and deep cervical flexor strengthening as tolerated; e-stim/heat as needed.          Problem List Patient Active Problem List   Diagnosis Date Noted  . Nonallopathic lesion of cervical region 10/21/2014  . Nonallopathic lesion of thoracic region 10/21/2014  . Nonallopathic lesion-rib cage 10/21/2014  . Cervicogenic headache 10/18/2014  . NECK PAIN 01/02/2007  . PAP SMEAR, ABNORMAL 01/02/2007    [name] 11/29/2014, 4:12 PM  [place], [place], [address] Phone: [phone]   Fax:  [phone]   Ruben Im, PT 11/29/2014 4:20 PM Phone: [phone] Fax: [phone]

## 2014-12-02 ENCOUNTER — Encounter: Payer: 59 | Admitting: Physical Therapy

## 2014-12-06 ENCOUNTER — Ambulatory Visit: Payer: 59 | Attending: Family Medicine | Admitting: Physical Therapy

## 2014-12-06 DIAGNOSIS — M6281 Muscle weakness (generalized): Secondary | ICD-10-CM

## 2014-12-06 DIAGNOSIS — M2560 Stiffness of unspecified joint, not elsewhere classified: Secondary | ICD-10-CM

## 2014-12-06 DIAGNOSIS — M542 Cervicalgia: Secondary | ICD-10-CM | POA: Diagnosis not present

## 2014-12-07 NOTE — Therapy (Signed)
[facility] [address] [place], [place], [address] Phone: [phone]   Fax:  [phone]  Physical Therapy Treatment  Patient Details  Name: Tanya Bush MRN: [id number] Date of Birth: 07-15-1975 Referring Provider:  Juluis Rainier, MD  Encounter Date: 12/06/2014      PT End of Session - 12/07/14 1227    Visit Number 8   Number of Visits 16   Date for PT Re-Evaluation 12/23/14   Authorization Type UMR   PT Start Time 1500   PT Stop Time 1600   PT Time Calculation (min) 60 min   Activity Tolerance Patient tolerated treatment well      Past Medical History  Diagnosis Date  . Headache     Past Surgical History  Procedure Laterality Date  . Cervical biopsy  w/ loop electrode excision    . Thyroid cyst excision      There were no vitals filed for this visit.  Visit Diagnosis:  Acute neck pain  Joint stiffness of spine  Muscle weakness of right upper extremity      Subjective Assessment - 12/06/14 1500    Subjective Drove to [place].  Feeling pretty good the last few days.   Currently in Pain? Yes   Pain Score 1    Pain Location Neck   Pain Orientation Right;Left;Mid   Pain Type Chronic pain   Aggravating Factors  sensitive to different to pillows;  prolonged sitting   Pain Relieving Factors e-stim heat;     Pain Score 1   Pain Location Back   Pain Orientation Right;Left;Mid   Pain Onset More than a month ago   Pain Frequency Constant                         [facility] Adult PT Treatment/Exercise - 12/07/14 0001    Moist Heat Therapy   Number Minutes Moist Heat 15 Minutes   Moist Heat Location Cervical;Lumbar Spine   Electrical Stimulation   Electrical Stimulation Location cervical and lumbar   Electrical Stimulation Action IFC   Electrical Stimulation Parameters 8 ma   Electrical Stimulation Goals Pain   Manual Therapy   Soft tissue mobilization lumbar paraspinals, right  rhomboids, upper traps, suboccipitals   Myofascial Release Graston instrument assisted myofascial lumbar, right scapular region   Scapular Mobilization Graston instrument assisted distraction   Manual Traction Graston instrument assisted manual traction 5x 60 sec          Trigger Point Dry Needling - 12/07/14 1225    Consent Given? Yes   Muscles Treated Upper Body Upper trapezius;Suboccipitals muscle group;Levator scapulae;Rhomboids   Muscles Treated Lower Body --  lumbar longissimus superficial only   Upper Trapezius Response Twitch reponse elicited;Palpable increased muscle length   SubOccipitals Response Palpable increased muscle length   Levator Scapulae Response Twitch response elicited;Palpable increased muscle length   Rhomboids Response Palpable increased muscle length      Performed bilaterally          PT Short Term Goals - 12/07/14 1231    PT SHORT TERM GOAL #1   Title The patient will have improved cervical flexion to 50 degrees, left sidebending to 40 degrees and right rotation to 45 degrees needed for driving and work activities.   Status Achieved   PT SHORT TERM GOAL #2   Title Patient will report pain and function improved by 25%   Status Achieved   PT SHORT TERM GOAL #3  Title Neck Disability Index improved to 44% indicating improved function with less pain   Time 4   Period Weeks   Status On-going           PT Long Term Goals - 12/07/14 1232    PT LONG TERM GOAL #1   Title Patient will be independent in HEP and self manangement techniques.   Time 8   Period Weeks   Status On-going   PT LONG TERM GOAL #2   Title Cervical flexion ROM improved to 55 degrees and cervical sidebending and rotation will be nearly symmetrical for improved mobility with home and work activities.   Time 8   Period Weeks   Status Partially Met   PT LONG TERM GOAL #3   Title Right scapular strength and deep cervical muscle strength to 4+/5 needed for lifting at home  and work.   Time 8   Period Weeks   Status On-going   PT LONG TERM GOAL #4   Title Neck Disability Index improved to 38% indicating improved function with less pain   Time 8   Period Weeks   Status On-going               Plan - 12/07/14 1227    Clinical Impression Statement The patient is doing well and seems to be improving from her recent exacerbation of neck and back pain.  She continues to be sensitive to deep muscle dry needling but superficial needling seems to be helpful in addition to Graston technique.  Therapist closely monitoring response throughout.   Will resume strengthening of core and scapular stabilizers next week.     PT Next Visit Plan Resume periscapular and deep cervical flexor strengthening;  supine band exercises;  Graston, needling as needed; estim/heat as needed        Problem List Patient Active Problem List   Diagnosis Date Noted  . Nonallopathic lesion of cervical region 10/21/2014  . Nonallopathic lesion of thoracic region 10/21/2014  . Nonallopathic lesion-rib cage 10/21/2014  . Cervicogenic headache 10/18/2014  . NECK PAIN 01/02/2007  . PAP SMEAR, ABNORMAL 01/02/2007    [name] 12/07/2014, 12:33 PM  [facility] [address] [place], [place], [address] Phone: [phone]   Fax:  [phone]   Ruben Im, PT 12/07/2014 12:34 PM Phone: [phone] Fax: [phone]

## 2014-12-09 ENCOUNTER — Ambulatory Visit: Payer: 59 | Admitting: Family Medicine

## 2014-12-13 ENCOUNTER — Ambulatory Visit: Payer: 59 | Admitting: Physical Therapy

## 2014-12-19 ENCOUNTER — Ambulatory Visit (INDEPENDENT_AMBULATORY_CARE_PROVIDER_SITE_OTHER): Payer: 59 | Admitting: Neurology

## 2014-12-19 ENCOUNTER — Encounter: Payer: Self-pay | Admitting: Neurology

## 2014-12-19 VITALS — BP 110/70 | HR 66 | Wt 146.6 lb

## 2014-12-19 DIAGNOSIS — M542 Cervicalgia: Secondary | ICD-10-CM

## 2014-12-19 DIAGNOSIS — R51 Headache: Secondary | ICD-10-CM | POA: Diagnosis not present

## 2014-12-19 DIAGNOSIS — G4486 Cervicogenic headache: Secondary | ICD-10-CM

## 2014-12-19 NOTE — Progress Notes (Signed)
NEUROLOGY FOLLOW UP OFFICE NOTE  GENNETT GARCIA [id number]  HISTORY OF PRESENT ILLNESS: Tanya Bush is a 39 year old right-handed female with who follows up for cervicogenic headache and neck pain.  UPDATE: She was referred to Dr. Antoine Primas for OMT.  She also has been undergoing PT.  She felt that the PT really helped.  Headaches have pretty much resolved.  She had one set back over the past month, which resolved after a couple of days.  HISTORY: On the evening of 10/08/14, she developed severe throbbing pain on both sides of the neck, left worse than right, and radiating up to the bilateral posterior and parietal head and down to the upper back.  Moving neck in all directions exacerbates it, but mostly neck turn to left and side-bend to right.  Laying supine or on side in bed caused increased pain.  On 10/10/14, she saw her PCP.  She was prescribed Flexeril  and Naproxen , which have not been too effective.  She presented to the ED on 10/13/14 for worsening pain, as well as numbness and tingling on the left from the jaw line down to the rhomboids.  There was no associated weakness or numbness involving the arm or hand.  CT of head and cervical spine were unremarkable.  She received a Toradol injection and was discharged with Vicodin.  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  Pain has improved but it is still present and causes discomfort.  She also still notes tingling in the neck and into the shoulder.  Flexeril at night is ineffective.  She denies any fall, neck injury or strenuous activity preceding onset of the pain.  She has history of menstrual migraines, but these are different.  She reported an episode of numbness and tingling of the extremities, fatigue, weakness in hands and halitosis in 2007 following the birth of her first child.  She had a workup for MS,  including MRIs which were negative.  PAST MEDICAL HISTORY: Past Medical History  Diagnosis Date  . Headache     MEDICATIONS: No current outpatient prescriptions on file prior to visit.   No current facility-administered medications on file prior to visit.    ALLERGIES: No Known Allergies  FAMILY HISTORY: Family History  Problem Relation Age of Onset  . Thyroid disease Father   . Thyroid disease Mother   . Hypertension Father   . Stroke Father   . Osteoarthritis Maternal Grandmother   . Cancer Maternal Grandfather     colon  . Cancer Paternal Grandmother     unknown     SOCIAL HISTORY: Social History   Social History  . Marital Status: Married    Spouse Name: N/A  . Number of Children: N/A  . Years of Education: N/A   Occupational History  . Not on file.   Social History Main Topics  . Smoking status: Former Games developer  . Smokeless tobacco: Never Used  . Alcohol Use: 0.0 oz/week    0 Standard drinks or equivalent per week     Comment: ocassionally weekend  . Drug Use: No  . Sexual Activity:    Partners: Male    Birth Control/ Protection: IUD     Comment: Mirena inserted 08/2005   Other Topics Concern  . Not on file   Social History Narrative    REVIEW OF SYSTEMS: Constitutional: No fevers,  chills, or sweats, no generalized fatigue, change in appetite Eyes: No visual changes, double vision, eye pain Ear, nose and throat: No hearing loss, ear pain, nasal congestion, sore throat Cardiovascular: No chest pain, palpitations Respiratory:  No shortness of breath at rest or with exertion, wheezes GastrointestinaI: No nausea, vomiting, diarrhea, abdominal pain, fecal incontinence Genitourinary:  No dysuria, urinary retention or frequency Musculoskeletal:  No neck pain, back pain Integumentary: No rash, pruritus, skin lesions Neurological: as above Psychiatric: No depression, insomnia, anxiety Endocrine: No palpitations, fatigue, diaphoresis, mood swings,  change in appetite, change in weight, increased thirst Hematologic/Lymphatic:  No anemia, purpura, petechiae. Allergic/Immunologic: no itchy/runny eyes, nasal congestion, recent allergic reactions, rashes  PHYSICAL EXAM: Filed Vitals:   12/19/14 1146  BP: 110/70  Pulse: 66   General: No acute distress.  Patient appears well-groomed.   Head:  Normocephalic/atraumatic Eyes:  Fundoscopic exam unremarkable without vessel changes, exudates, hemorrhages or papilledema. Neck: supple, no paraspinal tenderness, full range of motion Heart:  Regular rate and rhythm Lungs:  Clear to auscultation bilaterally Back: No paraspinal tenderness Neurological Exam: alert and oriented to person, place, and time. Attention span and concentration intact, recent and remote memory intact, fund of knowledge intact.  Speech fluent and not dysarthric, language intact.  CN II-XII intact. Fundoscopic exam unremarkable without vessel changes, exudates, hemorrhages or papilledema.  Bulk and tone normal, muscle strength 5/5 throughout.  Sensation to light touch intact.  Deep tendon reflexes 2+ throughout.  Finger to nose and heel to shin testing intact.  Gait normal.  IMPRESSION: Cervicogenic headache and neck pain  PLAN: She is doing well.  Follow up as needed.  15 minutes spent face to face with patient, over 50% spent discussing diagnosis and management.  Shon MilletAdam Jaffe, DO  CC:  Juluis RainierElizabeth Barnes, MD

## 2014-12-19 NOTE — Progress Notes (Signed)
Note routed

## 2014-12-20 ENCOUNTER — Ambulatory Visit: Payer: 59 | Admitting: Physical Therapy

## 2014-12-27 ENCOUNTER — Ambulatory Visit: Payer: 59 | Admitting: Physical Therapy

## 2014-12-27 DIAGNOSIS — M542 Cervicalgia: Secondary | ICD-10-CM

## 2014-12-27 DIAGNOSIS — M6281 Muscle weakness (generalized): Secondary | ICD-10-CM

## 2014-12-27 DIAGNOSIS — M2560 Stiffness of unspecified joint, not elsewhere classified: Secondary | ICD-10-CM

## 2014-12-27 NOTE — Therapy (Addendum)
[place], [place], [address] Phone: [phone]   Fax:  [phone]                                                         Physical Therapy Treatment/Recertification/ Discharge Summary Patient Details  Name: Tanya Bush MRN: [id number] Date of Birth: 12/28/1975 No Data Recorded  Encounter Date: 12/27/2014      PT End of Session - 12/27/14 1815    Visit Number 9   Number of Visits 16   Date for PT Re-Evaluation 12/27/14   Authorization Type UMR   PT Start Time 1500   PT Stop Time 1545   PT Time Calculation (min) 45 min   Activity Tolerance Patient tolerated treatment well      Past Medical History  Diagnosis Date  . Headache     Past Surgical History  Procedure Laterality Date  . Cervical biopsy  w/ loop electrode excision    . Thyroid cyst excision      There were no vitals filed for this visit.  Visit Diagnosis:  Acute neck pain  Joint stiffness of spine  Muscle weakness of right upper extremity      Subjective Assessment - 12/27/14 1811    Subjective Patient states she did well driving 8 hours to [place] and back.  Feels ready for discharge from PT.   Currently in Pain? No/denies   Pain Score 0-No pain            [facility] PT Assessment - 12/27/14 1505    Observation/Other Assessments   Neck Disability Index  4%   AROM   Cervical Flexion 60   Cervical Extension 70   Cervical - Right Side Bend 60   Cervical - Left Side Bend 45   Cervical - Right Rotation 50   Cervical - Left Rotation 55   Lumbar Flexion 80   Lumbar Extension 40   Lumbar - Right Side Bend 50   Lumbar - Left Side Bend 50   Strength   Cervical Flexion 4+/5   Cervical Extension 4+/5                     [facility] Adult PT Treatment/Exercise - 12/27/14 0001    Shoulder Exercises: Prone   Extension AROM;Left;10 reps   Horizontal ABduction 1 AROM;Right;10 reps   Other Prone Exercises planks 5x    Shoulder Exercises: Standing   Other Standing Exercises Wall push ups 5x   Other Standing Exercises elevation with 3# to assess scapular stab 2x                PT Education - 12/27/14 1814    Education provided Yes   Education Details discuss scapular retract/depress HEP   Person(s) Educated Patient   Methods Explanation;Demonstration   Comprehension Verbalized understanding;Returned demonstration          PT Short Term Goals - 12/27/14 1532    PT SHORT TERM GOAL #1   Title The patient will have improved cervical flexion to 50 degrees, left sidebending to 40 degrees and right rotation to 45 degrees needed for driving and work activities.   Status Achieved   PT SHORT TERM GOAL #2   Title Patient will report pain and function improved by 25%  Status Achieved   PT SHORT TERM GOAL #3   Title Neck Disability Index improved to 44% indicating improved function with less pain   Status Achieved           PT Long Term Goals - 12/27/14 1531    PT LONG TERM GOAL #1   Title Patient will be independent in HEP and self manangement techniques.   Time 8   Period Weeks   Status Achieved   PT LONG TERM GOAL #2   Title Cervical flexion ROM improved to 55 degrees and cervical sidebending and rotation will be nearly symmetrical for improved mobility with home and work activities.   Time 8   Period Weeks   Status Achieved   PT LONG TERM GOAL #3   Title Right scapular strength and deep cervical muscle strength to 4+/5 needed for lifting at home and work.   Time 8   Period Weeks   Status Achieved   PT LONG TERM GOAL #4   Title Neck Disability Index improved to 38% indicating improved function with less pain   Time 8   Period Weeks   Status Achieved               Plan - 12/27/14 1815    Clinical Impression Statement The patient has made signficant improvement in cervical and lumbar AROM without pain.  Mild to moderate right scapular winging with prone plank and we  discussed home exercises to address this issue.  Functional  outcome score improved from 52% to 4%.  All goals met.       PHYSICAL THERAPY DISCHARGE SUMMARY  Visits from Start of Care: 9  Current functional level related to goals / functional outcomes: See clinical impressions above   Remaining deficits: See above   Education / Equipment: HEP Plan: Patient agrees to discharge.  Patient goals were met. Patient is being discharged due to meeting the stated rehab goals.  ?????       Problem List Patient Active Problem List   Diagnosis Date Noted  . Nonallopathic lesion of cervical region 10/21/2014  . Nonallopathic lesion of thoracic region 10/21/2014  . Nonallopathic lesion-rib cage 10/21/2014  . Cervicogenic headache 10/18/2014  . NECK PAIN 01/02/2007  . PAP SMEAR, ABNORMAL 01/02/2007  Tanya Bush, PT 12/27/2014 6:22 PM Phone: [phone] Fax: [phone]  Tanya Bush 12/27/2014, 6:21 PM  [place], [place], [address] Phone: [phone]   Fax:  [phone]  Name: Tanya Bush MRN: [id number] Date of Birth: 06/16/1975

## 2015-01-03 ENCOUNTER — Ambulatory Visit: Payer: 59 | Admitting: Physical Therapy

## 2015-05-29 ENCOUNTER — Emergency Department (HOSPITAL_COMMUNITY)
Admission: EM | Admit: 2015-05-29 | Discharge: 2015-05-29 | Disposition: A | Discharge status: Home / Self Care | Payer: 59 | Attending: Emergency Medicine | Admitting: Emergency Medicine

## 2015-05-29 ENCOUNTER — Encounter (HOSPITAL_COMMUNITY): Payer: Self-pay | Admitting: *Deleted

## 2015-05-29 DIAGNOSIS — R42 Dizziness and giddiness: Secondary | ICD-10-CM

## 2015-05-29 DIAGNOSIS — Z87891 Personal history of nicotine dependence: Secondary | ICD-10-CM | POA: Diagnosis not present

## 2015-05-29 MED ORDER — MECLIZINE HCL 25 MG PO TABS
25.0000 mg | ORAL_TABLET | Freq: Once | ORAL | Status: AC
  Administered 2015-05-29: 25 mg via ORAL
  Filled 2015-05-29: qty 1

## 2015-05-29 MED ORDER — MECLIZINE HCL 25 MG PO TABS: 25.0000 mg | ORAL_TABLET | Freq: Three times a day (TID) | ORAL | Status: DC | PRN

## 2015-05-29 MED ORDER — DIAZEPAM 5 MG PO TABS: 2.5000 mg | ORAL_TABLET | Freq: Four times a day (QID) | ORAL | Status: DC | PRN

## 2015-05-29 NOTE — ED Provider Notes (Signed)
CSN: [id number]     Arrival date & time 05/29/15  [id number] History   First MD Initiated Contact with Patient 05/29/15 [phone]     Chief Complaint  Patient presents with  . Dizziness     (Consider location/radiation/quality/duration/timing/severity/associated sxs/prior Treatment) HPI Patient states she has had a virus for almost 3 weeks. She reports she had been having upper respiratory symptoms and cough with some fatigue. She been seeing her physician but at this point they considered her illness to be a virus. Patient reports that yesterday she was riding in the car with her husband and she turned to reach across for something and suddenly had a spinning quality of dizziness that started. Things seem like they're spinning and moving. This is made worse by position change of her head and walking. It is improved by remaining still and focusing on one spot. Patient reports when she is up and walking she has to hold onto something and feels like she is walking to the side. She feels like she might fall but there has not been any associated fall or loss of consciousness. Patient reports she has a vague pressure-like sensation in her head, no distinct headache. No visual changes or visual loss. No vomiting. No focal weakness numbness or tingling. No confusion. No fevers or neck stiffness. Past Medical History  Diagnosis Date  . Headache    Past Surgical History  Procedure Laterality Date  . Cervical biopsy  w/ loop electrode excision    . Thyroid cyst excision     Family History  Problem Relation Age of Onset  . Thyroid disease Father   . Thyroid disease Mother   . Hypertension Father   . Stroke Father   . Osteoarthritis Maternal Grandmother   . Cancer Maternal Grandfather     colon  . Cancer Paternal Grandmother     unknown    Social History  Substance Use Topics  . Smoking status: Former Games developermoker  . Smokeless tobacco: Never Used  . Alcohol Use: 0.0 oz/week    0 Standard drinks or  equivalent per week     Comment: ocassionally weekend   OB History    Gravida Para Term Preterm AB TAB SAB Ectopic Multiple Living   2 2        4      Review of Systems 10 Systems reviewed and are negative for acute change except as noted in the HPI.    Allergies  Review of patient's allergies indicates no known allergies.  Home Medications   Prior to Admission medications   Medication Sig Start Date End Date Taking? Authorizing Provider  levonorgestrel (MIRENA) 20 MCG/24HR IUD 1 each by Intrauterine route continuous.    Yes Historical Provider, MD  diazepam (VALIUM) 5 MG tablet Take 0.5 tablets (2.5 mg total) by mouth every 6 (six) hours as needed (VERTIGO). May take in addition to meclizine if symptoms are not controlled by meclizine alone. 05/29/15   Arby BarretteMarcy Karolyna Bianchini, MD  meclizine (ANTIVERT) 25 MG tablet Take 1 tablet (25 mg total) by mouth 3 (three) times daily as needed for dizziness. 05/29/15   Arby BarretteMarcy Lorain Fettes, MD   BP 126/84 mmHg  Pulse 71  Temp(Src) 97.9 F (36.6 C) (Oral)  Resp 18  SpO2 100% Physical Exam  Constitutional: She is oriented to person, place, and time. She appears well-developed and well-nourished.  HENT:  Head: Normocephalic and atraumatic.  Right Ear: External ear normal.  Left Ear: External ear normal.  Nose: Nose normal.  Mouth/Throat:  Oropharynx is clear and moist.  Bilateral TMs normal without erythema or bulging. Posterior oropharynx widely patent. Dentition in good condition. No facial swelling or asymmetry.  Eyes: EOM are normal. Pupils are equal, round, and reactive to light.  Neck: Neck supple. No thyromegaly present.  Cardiovascular: Normal rate, regular rhythm, normal heart sounds and intact distal pulses.   Pulmonary/Chest: Effort normal and breath sounds normal.  Abdominal: Soft. Bowel sounds are normal. She exhibits no distension. There is no tenderness.  Musculoskeletal: Normal range of motion. She exhibits no edema.  Lymphadenopathy:     She has no cervical adenopathy.  Neurological: She is alert and oriented to person, place, and time. She has normal strength. No cranial nerve deficit. She exhibits normal muscle tone. Coordination normal. GCS eye subscore is 4. GCS verbal subscore is 5. GCS motor subscore is 6.  Normal heel shin examination bilaterally. Patient is alert and appropriate with clear speech and normal cognitive function. Extraocular motions are intact. With Hallpike maneuver, patient felt increasing symptoms to the left.  Skin: Skin is warm, dry and intact.  Psychiatric: She has a normal mood and affect.    ED Course  Procedures (including critical care time) Labs Review Labs Reviewed - No data to display  Imaging Review No results found. I have personally reviewed and evaluated these images and lab results as part of my medical decision-making.   EKG Interpretation None      MDM   Final diagnoses:  Vertigo   Patient presents with symptoms consistent with positional vertigo. She has had preceding viral illness and now acute onset of vertigo with a position change. Patient is otherwise well without fever, nausea vomiting or neurologic deficit. At this time she will be started on Antivert with Valium if needed. She is counseled on follow-up with ENT and her family physician.    Arby Barrette, MD 05/29/15 [phone]

## 2015-05-29 NOTE — ED Notes (Signed)
Pt denies relief of lightheaded feeling after antivert.

## 2015-05-29 NOTE — Discharge Instructions (Signed)
Benign Positional Vertigo Vertigo is the feeling that you or your surroundings are moving when they are not. Benign positional vertigo is the most common form of vertigo. The cause of this condition is not serious (is benign). This condition is triggered by certain movements and positions (is positional). This condition can be dangerous if it occurs while you are doing something that could endanger you or others, such as driving.  CAUSES In many cases, the cause of this condition is not known. It may be caused by a disturbance in an area of the inner ear that helps your brain to sense movement and balance. This disturbance can be caused by a viral infection (labyrinthitis), head injury, or repetitive motion. RISK FACTORS This condition is more likely to develop in:  Women.  People who are 39 years of age or older. SYMPTOMS Symptoms of this condition usually happen when you move your head or your eyes in different directions. Symptoms may start suddenly, and they usually last for less than a minute. Symptoms may include:  Loss of balance and falling.  Feeling like you are spinning or moving.  Feeling like your surroundings are spinning or moving.  Nausea and vomiting.  Blurred vision.  Dizziness.  Involuntary eye movement (nystagmus). Symptoms can be mild and cause only slight annoyance, or they can be severe and interfere with daily life. Episodes of benign positional vertigo may return (recur) over time, and they may be triggered by certain movements. Symptoms may improve over time. DIAGNOSIS This condition is usually diagnosed by medical history and a physical exam of the head, neck, and ears. You may be referred to a health care provider who specializes in ear, nose, and throat (ENT) problems (otolaryngologist) or a provider who specializes in disorders of the nervous system (neurologist). You may have additional testing, including:  MRI.  A CT scan.  Eye movement tests. Your  health care provider may ask you to change positions quickly while he or she watches you for symptoms of benign positional vertigo, such as nystagmus. Eye movement may be tested with an electronystagmogram (ENG), caloric stimulation, the Dix-Hallpike test, or the roll test.  An electroencephalogram (EEG). This records electrical activity in your brain.  Hearing tests. TREATMENT Usually, your health care provider will treat this by moving your head in specific positions to adjust your inner ear back to normal. Surgery may be needed in severe cases, but this is rare. In some cases, benign positional vertigo may resolve on its own in 2-4 weeks. HOME CARE INSTRUCTIONS Safety  Move slowly.Avoid sudden body or head movements.  Avoid driving.  Avoid operating heavy machinery.  Avoid doing any tasks that would be dangerous to you or others if a vertigo episode would occur.  If you have trouble walking or keeping your balance, try using a cane for stability. If you feel dizzy or unstable, sit down right away.  Return to your normal activities as told by your health care provider. Ask your health care provider what activities are safe for you. General Instructions  Take over-the-counter and prescription medicines only as told by your health care provider.  Avoid certain positions or movements as told by your health care provider.  Drink enough fluid to keep your urine clear or pale yellow.  Keep all follow-up visits as told by your health care provider. This is important. SEEK MEDICAL CARE IF:  You have a fever.  Your condition gets worse or you develop new symptoms.  Your family or friends   notice any behavioral changes.  Your nausea or vomiting gets worse.  You have numbness or a "pins and needles" sensation. SEEK IMMEDIATE MEDICAL CARE IF:  You have difficulty speaking or moving.  You are always dizzy.  You faint.  You develop severe headaches.  You have weakness in your  legs or arms.  You have changes in your hearing or vision.  You develop a stiff neck.  You develop sensitivity to light.   This information is not intended to replace advice given to you by your health care provider. Make sure you discuss any questions you have with your health care provider.   Document Released: 11/26/2005 Document Revised: 11/09/2014 Document Reviewed: 06/13/2014 Elsevier Interactive Patient Education 2016 [employer]  

## 2015-05-29 NOTE — ED Notes (Signed)
Pt reports recent flu symptoms. Was feeling better then onset yesterday of dizziness, feels like room is spinning. Denies n/v, denies hx of vertigo. Reports when not feeling dizzy, she remains "light-headed."

## 2015-05-29 NOTE — ED Notes (Signed)
Pt reports dizziness as the room spinning.  Pt reports she notices dizziness when changing positions and going from sitting to standing.  Pt denies LOC or falls from dizziness.

## 2015-06-14 ENCOUNTER — Telehealth: Payer: Self-pay | Admitting: Neurology

## 2015-06-14 NOTE — Telephone Encounter (Signed)
Pt scheduled for tomorrow

## 2015-06-14 NOTE — Telephone Encounter (Signed)
Jene EveryKelly Djordjevic December 12, 1975. She called in today wanting to see if she could come in any sooner to see Dr. Everlena CooperJaffe. She has been having really bad headaches, which are making it hard for her to function. Will you call her back at her work  [phone] or after 2:00 pm she can be reached on her cell at [phone]. Thank you

## 2015-06-15 ENCOUNTER — Ambulatory Visit (INDEPENDENT_AMBULATORY_CARE_PROVIDER_SITE_OTHER): Payer: 59 | Admitting: Neurology

## 2015-06-15 ENCOUNTER — Encounter: Payer: Self-pay | Admitting: Neurology

## 2015-06-15 VITALS — BP 120/74 | HR 88 | Ht 64.0 in | Wt 142.0 lb

## 2015-06-15 DIAGNOSIS — R42 Dizziness and giddiness: Secondary | ICD-10-CM | POA: Diagnosis not present

## 2015-06-15 DIAGNOSIS — R51 Headache: Secondary | ICD-10-CM

## 2015-06-15 DIAGNOSIS — G9331 Postviral fatigue syndrome: Secondary | ICD-10-CM

## 2015-06-15 DIAGNOSIS — M791 Myalgia, unspecified site: Secondary | ICD-10-CM

## 2015-06-15 DIAGNOSIS — G933 Postviral fatigue syndrome: Secondary | ICD-10-CM

## 2015-06-15 DIAGNOSIS — R519 Headache, unspecified: Secondary | ICD-10-CM

## 2015-06-15 NOTE — Patient Instructions (Signed)
It sounds like your symptoms are viral.  The dizziness potentially could have been vestibular neuritis.  With the headache, we need to consider a viral meningitis as well. I would start with MRI of the brain and internal auditory canal with and without contrast. Pending results of MRI, we may need to consider a spinal tap as well, to look for signs of infection Follow up after testing.

## 2015-06-15 NOTE — Progress Notes (Signed)
NEUROLOGY FOLLOW UP OFFICE NOTE  ROSSANNA SPITZLEY [id number]  HISTORY OF PRESENT ILLNESS: Mareta Chesnut is a 40 year old right-handed female with who follows up for new symptoms, headache, vertigo, myalgia, fatigue.  Labs reviewed.  History also obtained by ED note from 05/29/15.  Physical therapy for cervicogenic headache was effective back in October.  In December and January, she developed a cold, consisting of nasal congestion.  In February, she developed fatigue and myalgias (mostly involving thighs).  She denied fever.  She went to her PCP where she was diagnosed with the flu and was treated with Tamiflu.  The symptoms would wax and wane, with alternating weeks of feeling okay.  She also noted left sided sore throat, ringing in the ears, weight loss.  She was found to have "fluid" in her left ear.  Lab work from 05/15/15 showed WBC of 10.5 with neutrophils of 77%.  CMP was normal.  CRP was 3.9, ANA negative, but Sed rate was mildly elevated at 44.  Thyroid panel was negative.  Vitamin D was 31.6 and Lyme was negative.  Repeat CBC on 06/02/15 was unremarkable with WBC of 8.  Sed rate was normal at 12.  In the last week of March, she was riding in a car.  She reached over and developed sudden onset of vertigo, described as spinning and was positional.  She received the Epley maneuver, which helped, but she continues to feel a heavy sensation of dizziness described as a "swimmy feeling".  It is left sided and constant.  Then over the past week, she developed a new posterior headache.  At night, it would wake her up and she would note a pounding headache in the back of her head, radiating to the left temple.  It would be less intense during the day.  She has history of menstrual migraines, but these are different.  She reported an episode of numbness and tingling of the extremities, fatigue, weakness in hands and halitosis in 2007 following the birth of her first child.  She had a workup for MS, including  MRIs which were negative.  She notes transient blurred vision.  PAST MEDICAL HISTORY: Past Medical History  Diagnosis Date  . Headache     MEDICATIONS: Current Outpatient Prescriptions on File Prior to Visit  Medication Sig Dispense Refill  . levonorgestrel (MIRENA) 20 MCG/24HR IUD 1 each by Intrauterine route continuous.      No current facility-administered medications on file prior to visit.    ALLERGIES: No Known Allergies  FAMILY HISTORY: Family History  Problem Relation Age of Onset  . Thyroid disease Father   . Thyroid disease Mother   . Hypertension Father   . Stroke Father   . Osteoarthritis Maternal Grandmother   . Cancer Maternal Grandfather     colon  . Cancer Paternal Grandmother     unknown     SOCIAL HISTORY: Social History   Social History  . Marital Status: Married    Spouse Name: N/A  . Number of Children: N/A  . Years of Education: N/A   Occupational History  . Not on file.   Social History Main Topics  . Smoking status: Former Games developer  . Smokeless tobacco: Never Used  . Alcohol Use: 0.0 oz/week    0 Standard drinks or equivalent per week     Comment: ocassionally weekend  . Drug Use: No  . Sexual Activity:    Partners: Male    Pharmacist, hospital Protection: IUD  Comment: Mirena inserted 08/2005   Other Topics Concern  . Not on file   Social History Narrative    REVIEW OF SYSTEMS: Constitutional: Fatigue.  No fevers, chills, or sweats Eyes: No visual changes, double vision, eye pain Ear, nose and throat: Fluid in left ear.  Sore throat Cardiovascular: No chest pain, palpitations Respiratory:  No shortness of breath at rest or with exertion, wheezes GastrointestinaI: No nausea, vomiting, diarrhea, abdominal pain, fecal incontinence Genitourinary:  No dysuria, urinary retention or frequency Musculoskeletal:  myalgia Integumentary: No rash, pruritus, skin lesions Neurological: as above Psychiatric: No depression, insomnia,  anxiety Endocrine: Weight loss Hematologic/Lymphatic:  No purpura, petechiae. Allergic/Immunologic: no itchy/runny eyes, nasal congestion, recent allergic reactions, rashes  PHYSICAL EXAM: Filed Vitals:   06/15/15 0930  BP: 120/74  Pulse: 88   General: No acute distress.  Patient appears well-groomed.  normal body habitus. Head:  Normocephalic/atraumatic Eyes:  Fundoscopic exam unremarkable without vessel changes, exudates, hemorrhages or papilledema. Neck: supple, no paraspinal tenderness, full range of motion Heart:  Regular rate and rhythm Lungs:  Clear to auscultation bilaterally Back: No paraspinal tenderness Neurological Exam: alert and oriented to person, place, and time. Attention span and concentration intact, recent and remote memory intact, fund of knowledge intact.  Speech fluent and not dysarthric, language intact.  CN II-XII intact. Fundoscopic exam unremarkable without vessel changes, exudates, hemorrhages or papilledema.  Bulk and tone normal, muscle strength 5/5 throughout.  Sensation to light touch, temperature and vibration intact.  Deep tendon reflexes 2+ throughout, toes downgoing.  Finger to nose and heel to shin testing intact.  Gait normal, Romberg negative.  IMPRESSION: Posterior headache, vertigo, myalgia, fatigue.  Symptoms seem post-viral.  From a neurologic perspective, the vertigo may be vestibular neuritis.  However, with the headache, we need to consider possible viral meningitis (she has been afebrile and does not look ill).  PLAN: 1.  We will check MRI of brain and internal auditory canal with and without contrast.  We will also get MRA of head too, given severe headache. 2.  Pending results of MRI, may need to consider LP too. 3.  Will also check CK given the myalgia 4.  Follow up after testing.  Shon MilletAdam Toriana Sponsel, DO  CC:  Juluis RainierElizabeth Barnes, MD

## 2015-06-15 NOTE — Progress Notes (Signed)
Chart forwarded.  

## 2015-06-19 ENCOUNTER — Telehealth: Payer: Self-pay

## 2015-06-19 DIAGNOSIS — R51 Headache: Principal | ICD-10-CM

## 2015-06-19 DIAGNOSIS — R519 Headache, unspecified: Secondary | ICD-10-CM

## 2015-06-19 NOTE — Telephone Encounter (Signed)
We can do it for without contrast

## 2015-06-19 NOTE — Telephone Encounter (Signed)
Working on authorization for pt's MRA and MRI w/ and w/o (IAC). MRA was approved. MRI (IAC) was only approved w/o contrast. Will need a peer to peer in order to have MRI done w/ and w/o contrast.   For Peer to peer please call [phone] option 3 Reference # [id number]. Or I can change test to w/o order. Please advise.

## 2015-06-19 NOTE — Telephone Encounter (Signed)
[employer] and [employer] aware of change.

## 2015-06-27 ENCOUNTER — Ambulatory Visit
Admission: RE | Admit: 2015-06-27 | Discharge: 2015-06-27 | Disposition: A | Discharge status: Home / Self Care | Payer: 59 | Source: Ambulatory Visit | Attending: Neurology | Admitting: Neurology

## 2015-06-27 ENCOUNTER — Other Ambulatory Visit: Payer: 59

## 2015-06-27 DIAGNOSIS — R51 Headache: Principal | ICD-10-CM

## 2015-06-27 DIAGNOSIS — R519 Headache, unspecified: Secondary | ICD-10-CM

## 2015-06-27 DIAGNOSIS — G9331 Postviral fatigue syndrome: Secondary | ICD-10-CM

## 2015-06-27 DIAGNOSIS — G933 Postviral fatigue syndrome: Secondary | ICD-10-CM

## 2015-06-28 ENCOUNTER — Telehealth: Payer: Self-pay | Admitting: Neurology

## 2015-06-28 NOTE — Telephone Encounter (Signed)
I called and spoke with Tanya Bush with MRI/MRA head results.  It is normal. Her headaches have improved, but she still has some dizziness and she feels like it is more difficult to concentrate.  She also notes some mild memory problems, such as remembering things she had said.  Since the headaches have improved and she has noted some improvement overall, we will hold off on further testing, such as LP.  My suspicion is these symptoms are prolonged recovery to an acute viral illness.  She has a follow up appointment on May 4.   She will follow up at that time, unless she continues to improve.

## 2015-07-06 ENCOUNTER — Ambulatory Visit (INDEPENDENT_AMBULATORY_CARE_PROVIDER_SITE_OTHER): Payer: 59 | Admitting: Neurology

## 2015-07-06 ENCOUNTER — Encounter: Payer: Self-pay | Admitting: Neurology

## 2015-07-06 VITALS — BP 110/66 | HR 73 | Ht 64.0 in | Wt 146.0 lb

## 2015-07-06 DIAGNOSIS — R42 Dizziness and giddiness: Secondary | ICD-10-CM

## 2015-07-06 NOTE — Progress Notes (Signed)
NEUROLOGY FOLLOW UP OFFICE NOTE  Tanya Bush [id number]  HISTORY OF PRESENT ILLNESS: Tanya Bush is a 40 year old right-handed female with who follows up for new symptoms, headache, vertigo, myalgia, fatigue.  Imaging of brain/internal auditory canal MRI and MRA personally reviewed.  UPDATE: MRI of brain and internal auditory canals with and without contrast and MRA of head from 06/27/15 were normal.  The posterior headache is now just a pressure sensation.  However, she still feels dizzy (as if on a rocking dock) and lightheaded.  There is no spinning sensation at this point.  It still comes and goes.  The dizziness makes it difficult to read and concentrate.  She has not had any fevers, visual obscurations, or hearing loss.  She initially had tinnitus in the left ear, which has resolved.  HISTORY: In December and January, she developed a cold, consisting of nasal congestion.  In February, she developed fatigue and myalgias (mostly involving thighs).  She denied fever.  She went to her PCP where she was diagnosed with the flu and was treated with Tamiflu.  The symptoms would wax and wane, with alternating weeks of feeling okay.  She also noted left sided sore throat, ringing in the ears, weight loss.  She was found to have "fluid" in her left ear.  Lab work from 05/15/15 showed WBC of 10.5 with neutrophils of 77%.  CMP was normal.  CRP was 3.9, ANA negative, but Sed rate was mildly elevated at 44.  Thyroid panel was negative.  Vitamin D was 31.6 and Lyme was negative.  Repeat CBC on 06/02/15 was unremarkable with WBC of 8.  Sed rate was normal at 12.  In the last week of March, she was Bush in a car.  She reached over and developed sudden onset of vertigo, described as spinning and was positional.  She received the Epley maneuver, which helped, but she continues to feel a heavy sensation of dizziness described as a "swimmy feeling".  It is left sided and constant.  Then over the past week, she  developed a new posterior headache.  At night, it would wake her up and she would note a pounding headache in the back of her head, radiating to the left temple.  It would be less intense during the day.  She has history of menstrual migraines, but these are different.  She reported an episode of numbness and tingling of the extremities, fatigue, weakness in hands and halitosis in 2007 following the birth of her first child.  She had a workup for MS, including MRIs which were negative.  She notes transient blurred vision.  PAST MEDICAL HISTORY: Past Medical History  Diagnosis Date  . Headache     MEDICATIONS: Current Outpatient Prescriptions on File Prior to Visit  Medication Sig Dispense Refill  . levonorgestrel (MIRENA) 20 MCG/24HR IUD 1 each by Intrauterine route continuous.      No current facility-administered medications on file prior to visit.    ALLERGIES: No Known Allergies  FAMILY HISTORY: Family History  Problem Relation Age of Onset  . Thyroid disease Father   . Thyroid disease Mother   . Hypertension Father   . Stroke Father   . Osteoarthritis Maternal Grandmother   . Cancer Maternal Grandfather     colon  . Cancer Paternal Grandmother     unknown     SOCIAL HISTORY: Social History   Social History  . Marital Status: Married    Spouse Name: N/A  .  Number of Children: N/A  . Years of Education: N/A   Occupational History  . Not on file.   Social History Main Topics  . Smoking status: Former Games developermoker  . Smokeless tobacco: Never Used  . Alcohol Use: 0.0 oz/week    0 Standard drinks or equivalent per week     Comment: ocassionally weekend  . Drug Use: No  . Sexual Activity:    Partners: Male    Birth Control/ Protection: IUD     Comment: Mirena inserted 08/2005   Other Topics Concern  . Not on file   Social History Narrative    REVIEW OF SYSTEMS: Constitutional: No fevers, chills, or sweats, no generalized fatigue, change in appetite Eyes: No  visual changes, double vision, eye pain Ear, nose and throat: No hearing loss, ear pain, nasal congestion, sore throat Cardiovascular: No chest pain, palpitations Respiratory:  No shortness of breath at rest or with exertion, wheezes GastrointestinaI: No nausea, vomiting, diarrhea, abdominal pain, fecal incontinence Genitourinary:  No dysuria, urinary retention or frequency Musculoskeletal:  No neck pain, back pain Integumentary: No rash, pruritus, skin lesions Neurological: as above Psychiatric: No depression, insomnia, anxiety Endocrine: No palpitations, fatigue, diaphoresis, mood swings, change in appetite, change in weight, increased thirst Hematologic/Lymphatic:  No anemia, purpura, petechiae. Allergic/Immunologic: no itchy/runny eyes, nasal congestion, recent allergic reactions, rashes  PHYSICAL EXAM: Filed Vitals:   07/06/15 1522  BP: 110/66  Pulse: 73   General: No acute distress.  Patient appears well-groomed.  normal body habitus. Head:  Normocephalic/atraumatic Eyes:  Fundi examined but not visualized Neck: supple, no paraspinal tenderness, full range of motion Heart:  Regular rate and rhythm Lungs:  Clear to auscultation bilaterally Back: No paraspinal tenderness Neurological Exam: alert and oriented to person, place, and time. Attention span and concentration intact, recent and remote memory intact, fund of knowledge intact.  Speech fluent and not dysarthric, language intact.  CN II-XII intact. Gait normal, able to tandem walk, Romberg negative.  IMPRESSION: I suspect this is either chronic subjective dizziness or residual symptoms of a postviral syndrome.  I don't suspect vestibular migraine.  PLAN: 1.  I would like her to be evaluated by ENT.  I will send her to Dr. Haroldine Bush for his input. 2.  She does have some upper cervical tightness, so we discussed that she may want to contact Dr. Katrinka Bush again. 3.  Follow up as needed.  She will contact us if symptoms  worsen.  30 minutes spent face to face with patient, over 50% spent discussing diagnoses and plan.  Tanya MilletAdam Jaffe, DO  CC:  Tanya RainierElizabeth Barnes, MD

## 2015-07-06 NOTE — Patient Instructions (Signed)
I think you likely have chronic subjective dizziness or still residual symptoms of a postviral syndrome.  I would like you to see ENT.  I will refer you to Dr. Hermelinda MedicusJames Crossley.  Contact me if you would like referral for vestibular rehab

## 2015-07-07 NOTE — Progress Notes (Signed)
Chart forwarded.  

## 2016-08-16 ENCOUNTER — Ambulatory Visit: Payer: 59 | Admitting: Neurology

## 2016-10-08 IMAGING — CT CT HEAD W/O CM
4 of 5 series · 17 of 47 positions shown, 18 images · non-contrast
Comparison: None.

CLINICAL DATA: Headache, no known injury

EXAM:
CT HEAD WITHOUT CONTRAST
CT CERVICAL SPINE WITHOUT CONTRAST
TECHNIQUE: Multidetector CT imaging of the head and cervical spine was
performed following the standard protocol without intravenous
contrast. Multiplanar CT image reconstructions of the cervical spine
were also generated.

[Series 2: head 4.8 h37s · axial · 0.45mm/px · z∈[+12,+90]mm · 3 of 32 slices shown, 4 images]
[im 8/32  brain]
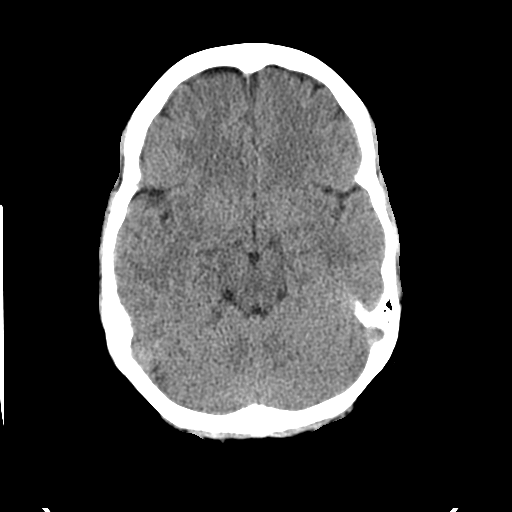
[im 8/32  bone]
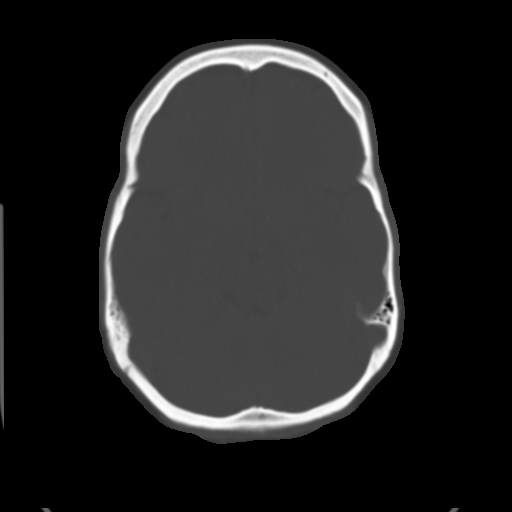
[im 16/32  brain]
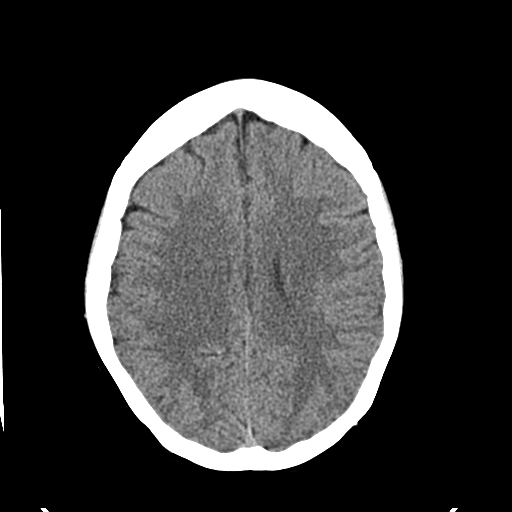
[im 24/32  brain]
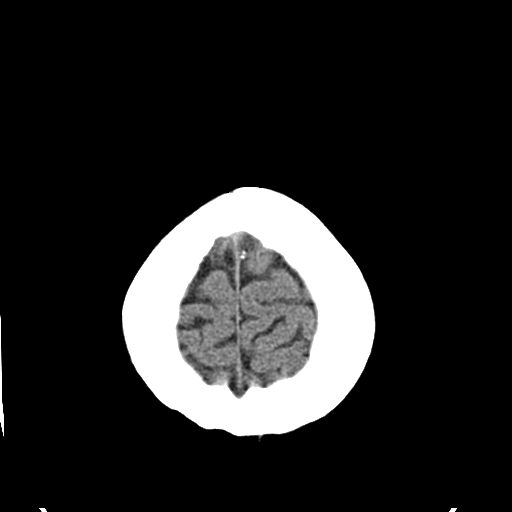

[Series 8: c_spine 2.0 coronal · coronal · 0.32mm/px · 3 of 66 slices shown]
[im 22/66  brain]
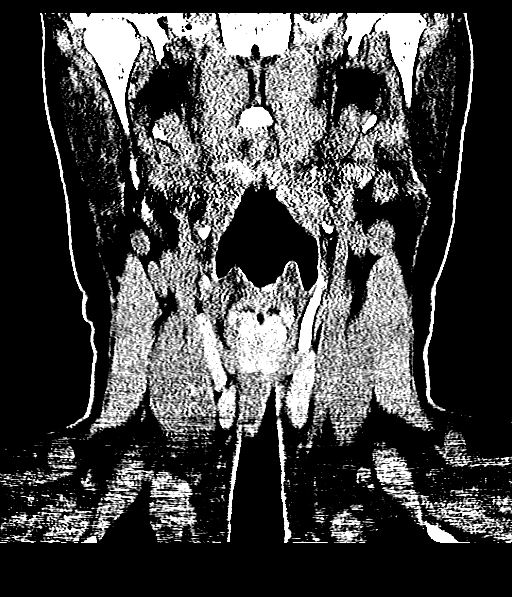
[im 29/66  brain]
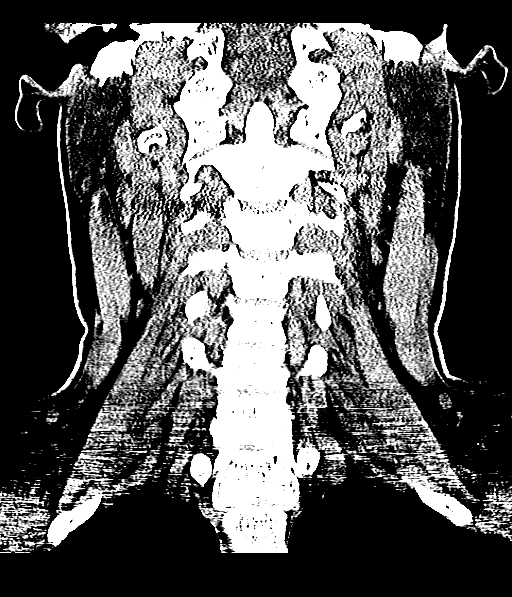
[im 37/66  brain]
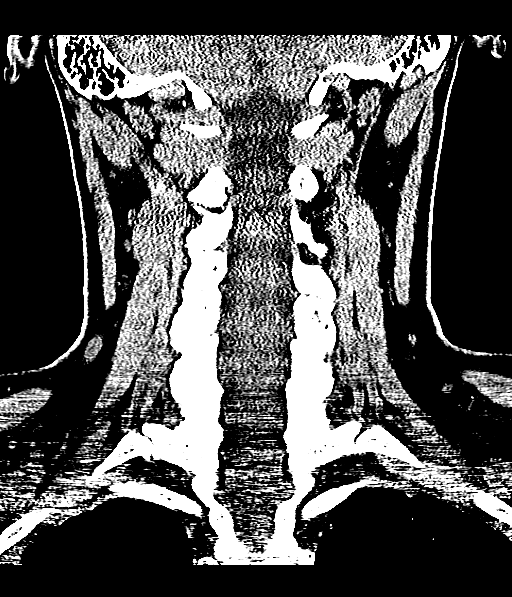

[Series 9: c_spine 2.0 sagittal · sagittal · 0.31mm/px · 3 of 64 slices shown]
[im 22/64  brain]
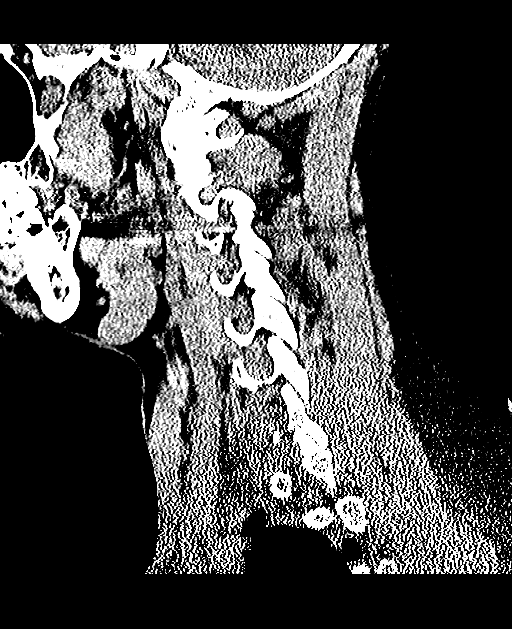
[im 32/64  brain]
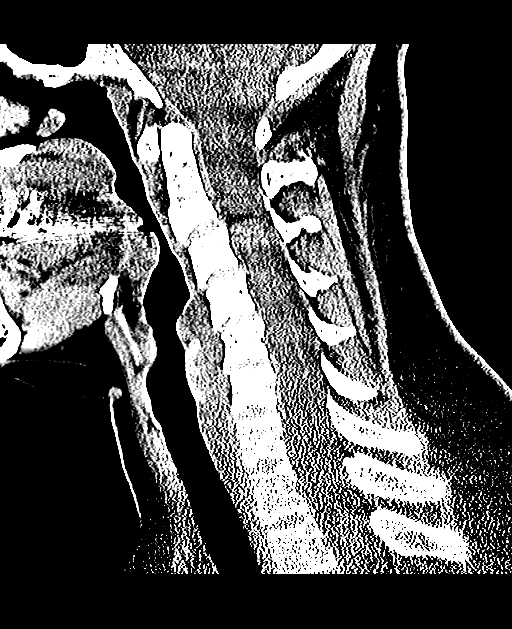
[im 43/64  brain]
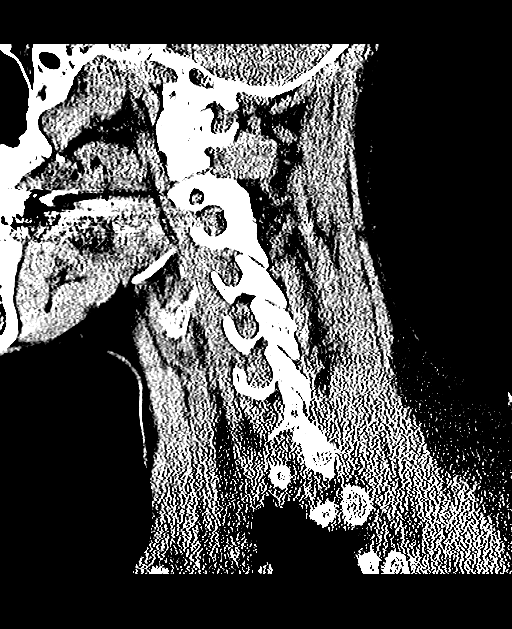

[Series 10: c_spine 2.0 orth ax · axial · 0.31mm/px · z∈[-215,-72]mm · 8 of 90 slices shown]
[im 8/90  brain]
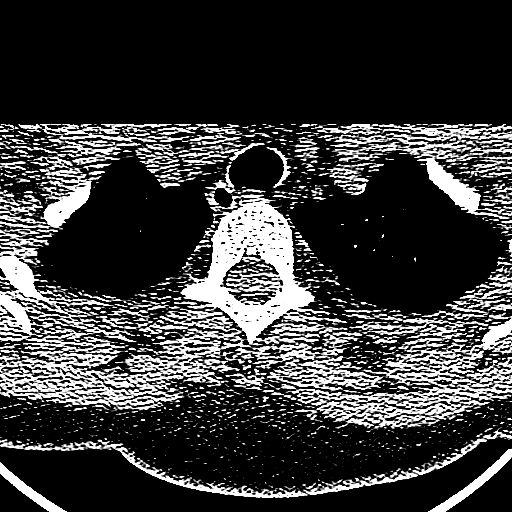
[im 23/90  brain]
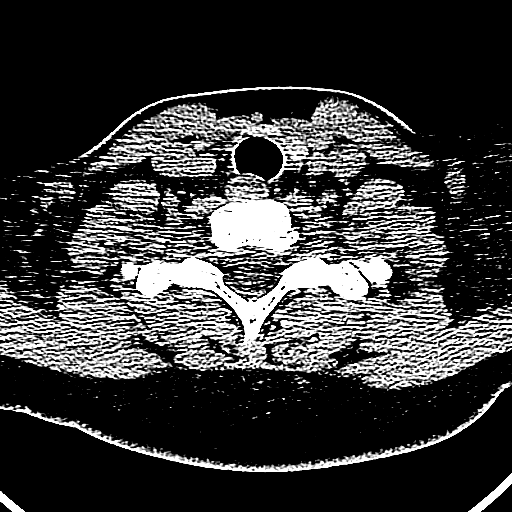
[im 30/90  brain]
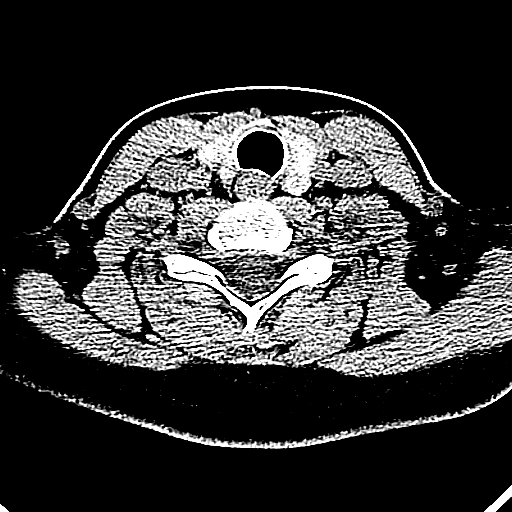
[im 38/90  brain]
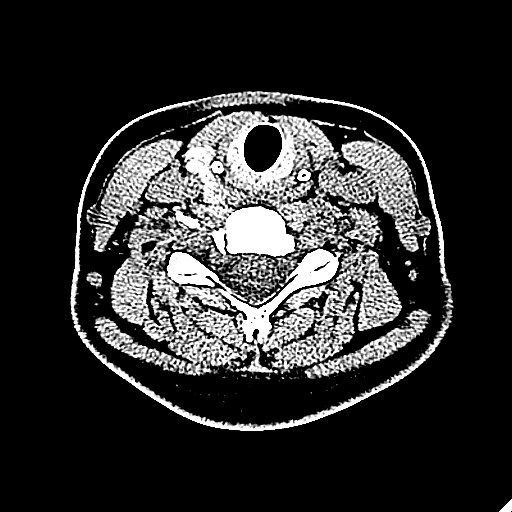
[im 52/90  brain]
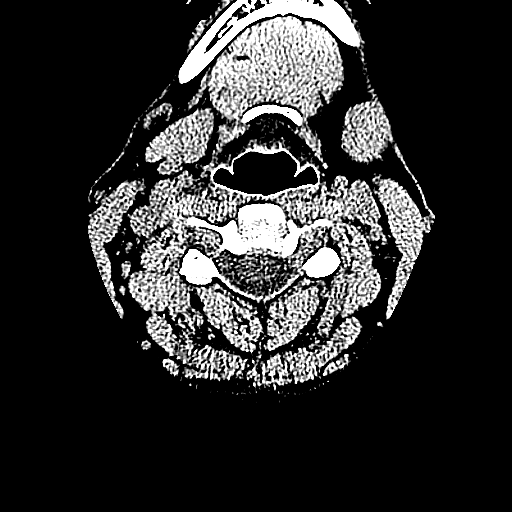
[im 60/90  brain]
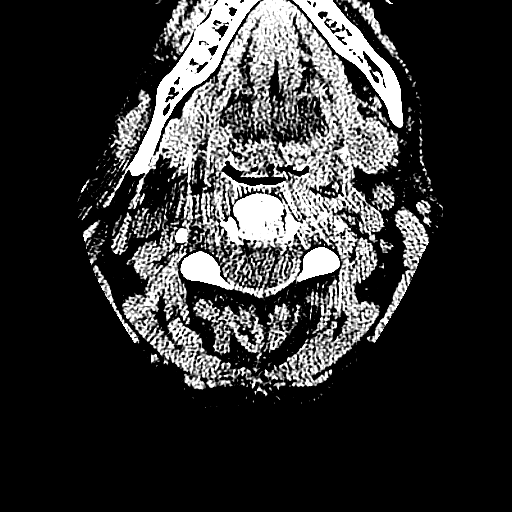
[im 67/90  brain]
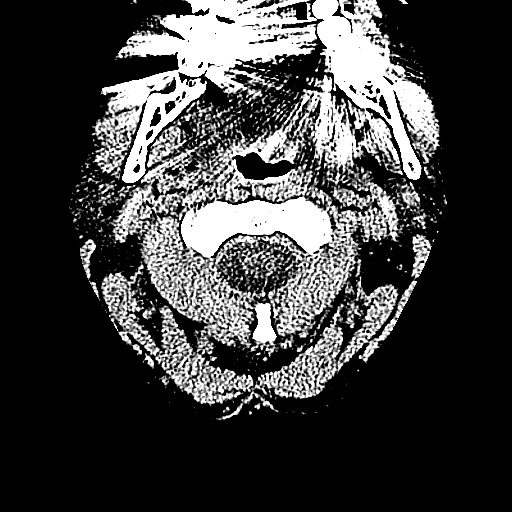
[im 82/90  brain]
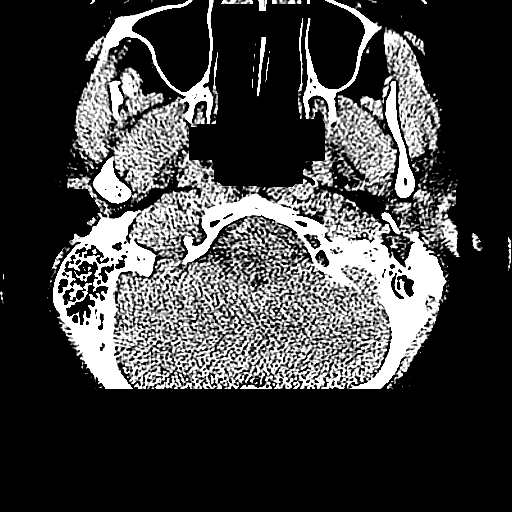

[17 of 47 positions shown; findings below may reference images not displayed]

FINDINGS: CT HEAD FINDINGS

No skull fracture is noted. Paranasal sinuses and mastoid air cells
are unremarkable.

No intracranial hemorrhage, mass effect or midline shift.

No acute cortical infarction. No hydrocephalus. No mass lesion is
noted on this unenhanced scan.

CT CERVICAL SPINE FINDINGS

Axial images of the cervical spine shows no acute fracture or
subluxation. Computer processed images shows no acute fracture or
subluxation. There is no prevertebral soft tissue swelling. Cervical
airway is patent. Spinal canal is patent. Alignment and vertebral
body heights are preserved.
IMPRESSION: 1. No acute intracranial abnormality.
2. No cervical spine acute fracture or subluxation.

## 2016-10-09 IMAGING — US US SOFT TISSUE HEAD/NECK
1 series · 13 of 25 positions shown · non-contrast
Comparison: CT cervical spine 10/13/2014, ultrasound thyroid
11/27/2012, 09/09/2011, 02/12/2011, 02/04/2011

CLINICAL DATA: 38-year-old female with a history of thyroid
nodules.

Left-sided thyroid nodule has been previously biopsied 02/12/2011.
EXAM:
THYROID ULTRASOUND
TECHNIQUE: Ultrasound examination of the thyroid gland and adjacent soft
tissues was performed.

[Series 1: us soft tissue head/neck · 0.06mm/px · 13 of 59 slices shown]
[im 1/59]
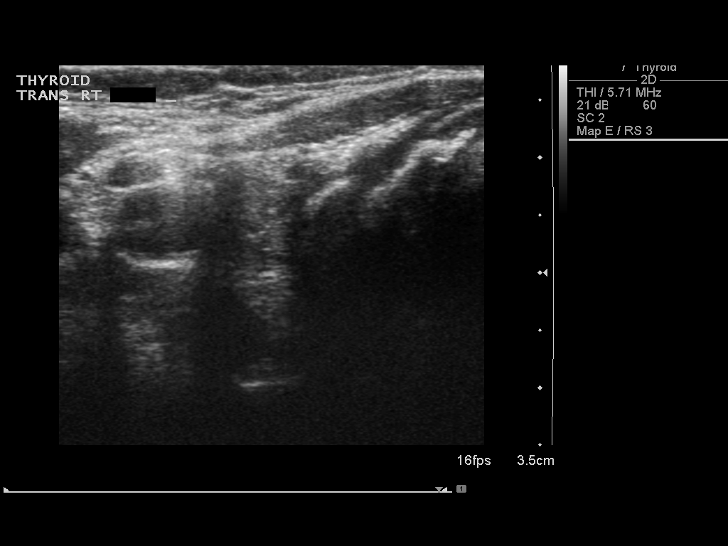
[im 5/59]
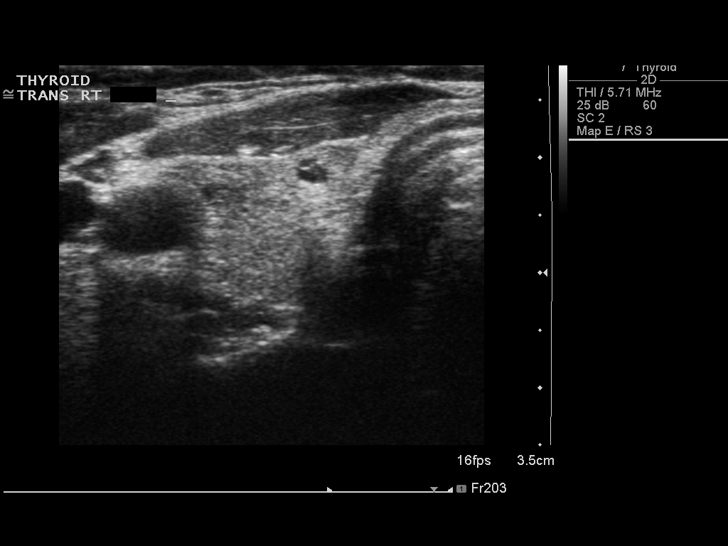
[im 10/59]
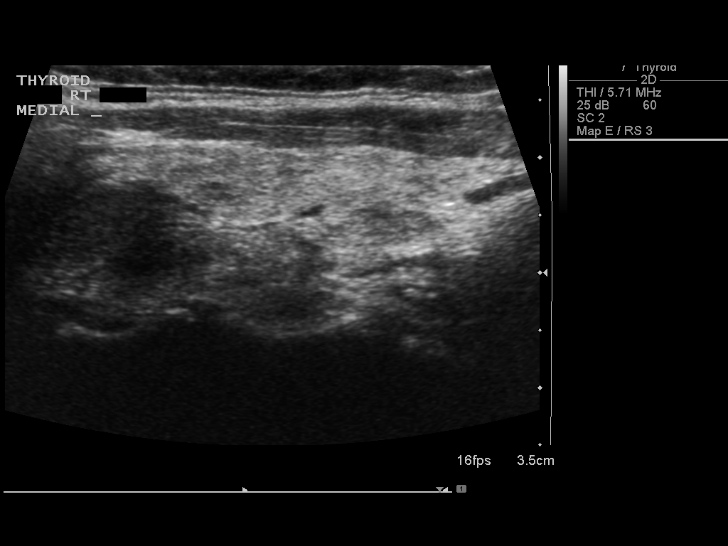
[im 15/59]
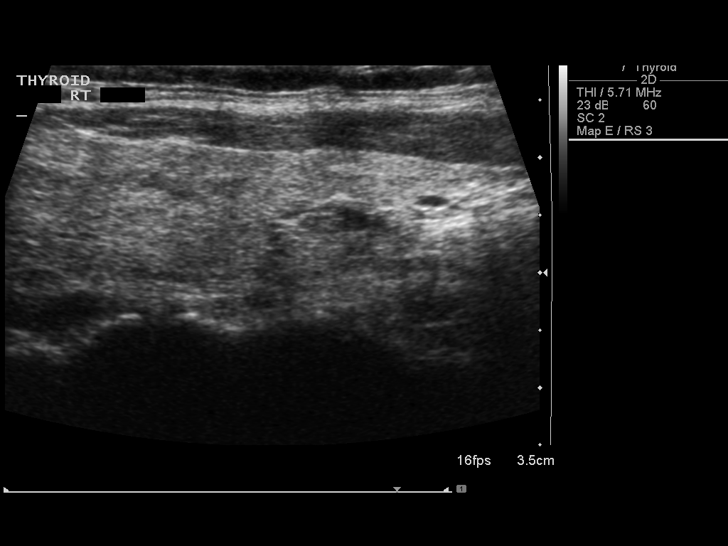
[im 20/59]
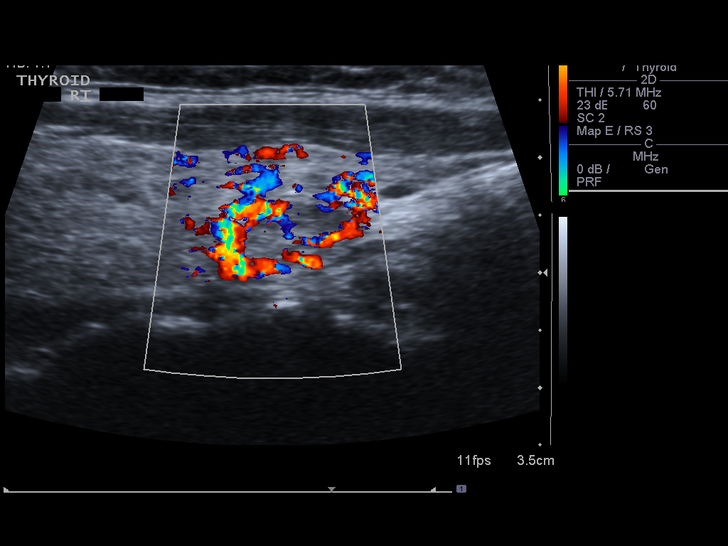
[im 25/59]
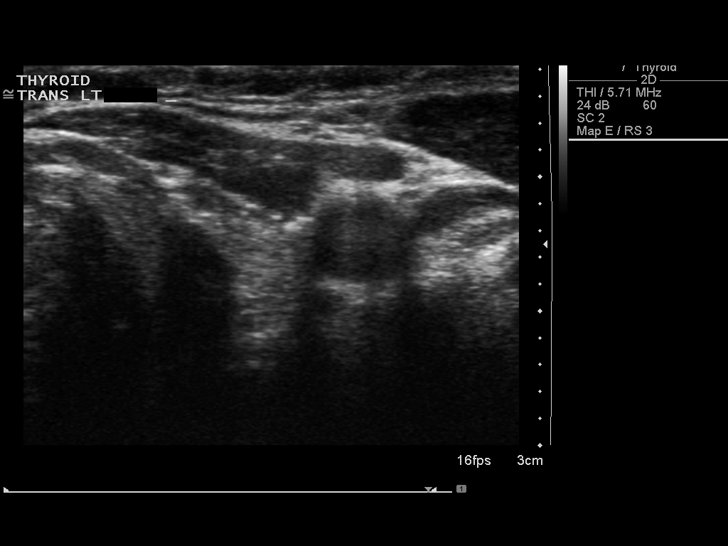
[im 30/59]
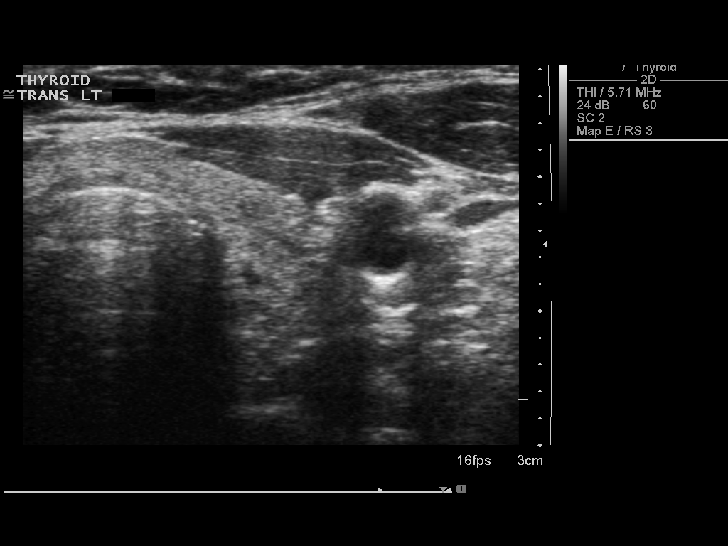
[im 34/59]
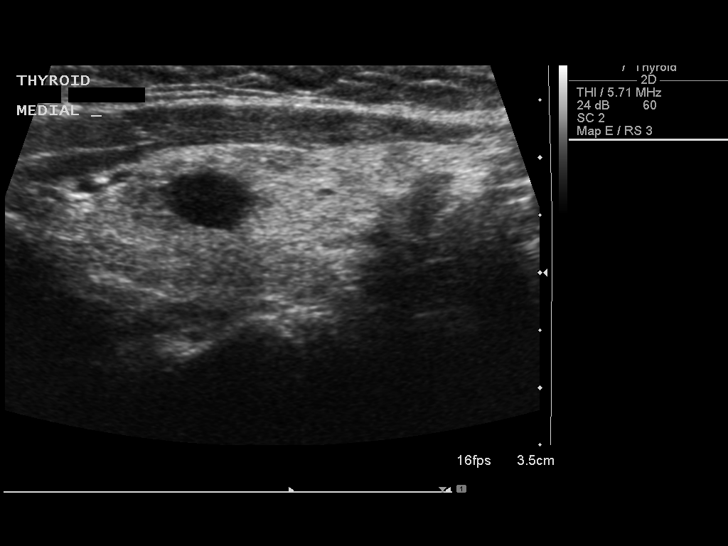
[im 39/59]
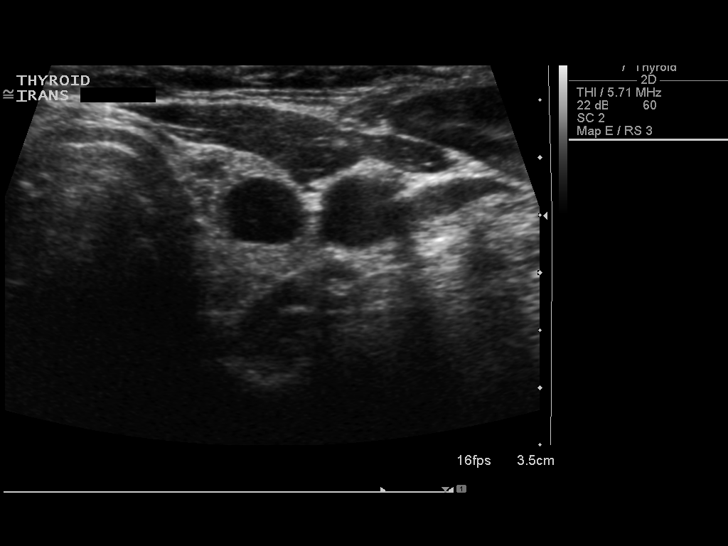
[im 44/59]
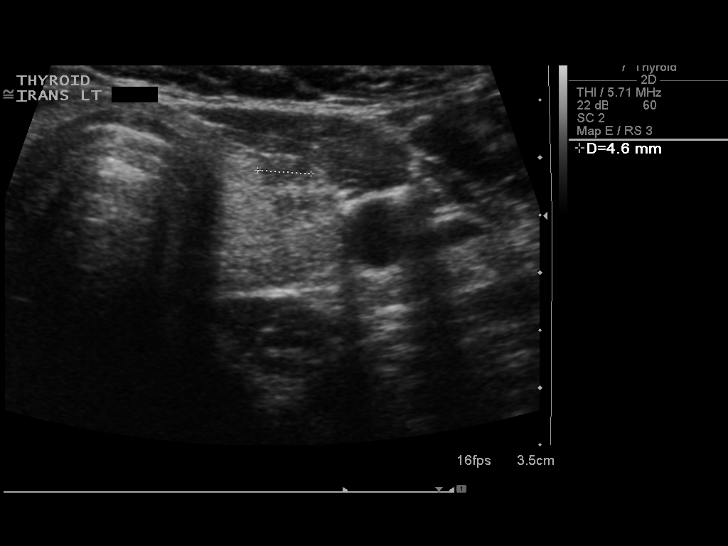
[im 49/59]
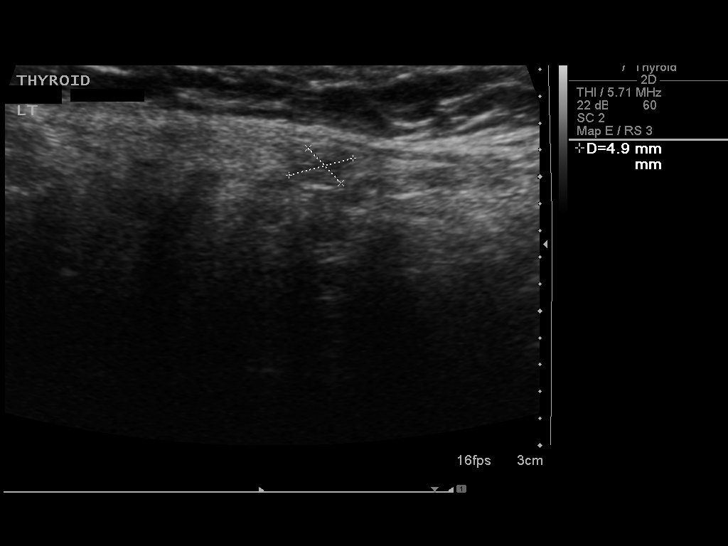
[im 54/59]
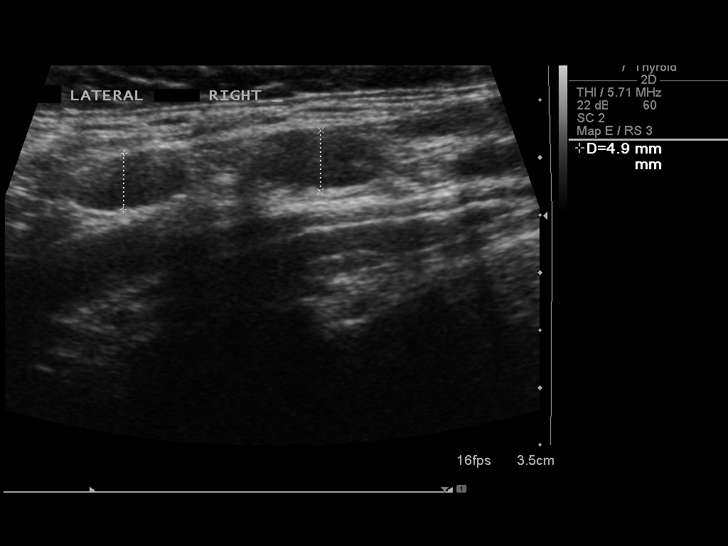
[im 59/59]
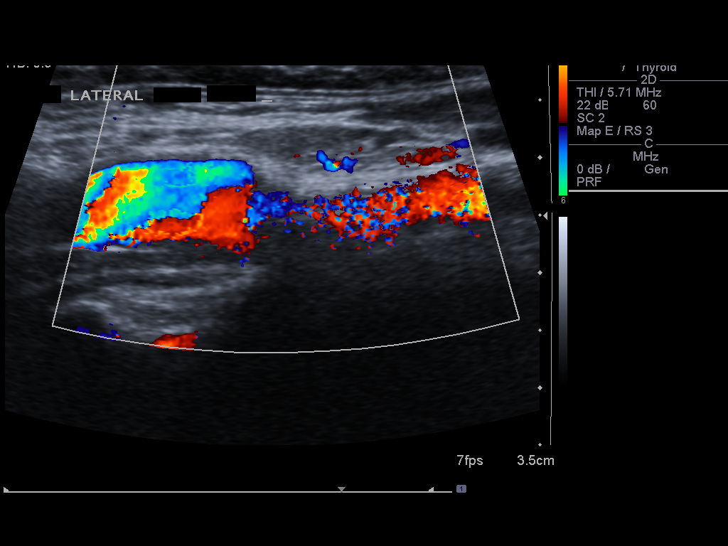

[13 of 25 positions shown; findings below may reference images not displayed]

FINDINGS: Right thyroid lobe

Measurements:  4.6 cm x 1.3 cm x 1.9 cm

Heterogeneous appearance of the right thyroid with relatively no
increased flow.

Largest nodule is inferior measuring 1.2 cm x 7 mm x 1.0 cm.
Partially solid and partially cystic with no internal
calcifications.

Left thyroid lobe

Measurements: 4.1 cm x 1.2 cm x 1.1 cm. Heterogeneous appearance of
the left thyroid with no increased flow. Largest lesion measures
cm x 6 mm x 7 mm with solid and cystic features. No internal
calcifications. Similar appearance to the prior.

More inferior nodule measures no more than 6 mm.

Isthmus

Thickness: 2 mm.  Single isthmic nodule measures no more than 5 mm.

Lymphadenopathy

Lymph nodes within the head and neck not enlarged by head and neck
criteria.
IMPRESSION: Multinodular heterogeneous thyroid. No nodules on the current study
meet criteria for biopsy.

Findings do not meet current SRU consensus criteria for biopsy.
Follow-up by clinical exam is recommended. If patient has known risk
factors for thyroid carcinoma, consider follow-up ultrasound in 12
months. If patient is clinically hyperthyroid, consider nuclear
medicine thyroid uptake and scan.Reference: Management of Thyroid
Nodules Detected at US: Society of Radiologists in Ultrasound

## 2017-04-28 ENCOUNTER — Telehealth: Payer: Self-pay | Admitting: Neurology

## 2017-04-28 NOTE — Telephone Encounter (Signed)
Patient has not been seen in our clinic for almost two years (07/2015) and will need to be first available if she needs a follow up. If urgent she should see primary care or urgent care.

## 2017-04-28 NOTE — Telephone Encounter (Signed)
Pt called and said she is having a lot of dizziness and vertigo and wanted to see Dr Everlena CooperJaffe today, please call and advise

## 2017-05-01 ENCOUNTER — Other Ambulatory Visit: Payer: Self-pay | Admitting: *Deleted

## 2017-05-01 ENCOUNTER — Encounter: Payer: Self-pay | Admitting: Neurology

## 2017-05-01 ENCOUNTER — Ambulatory Visit (INDEPENDENT_AMBULATORY_CARE_PROVIDER_SITE_OTHER): Payer: 59 | Admitting: Neurology

## 2017-05-01 VITALS — BP 104/70 | HR 72 | Wt 151.5 lb

## 2017-05-01 DIAGNOSIS — R42 Dizziness and giddiness: Secondary | ICD-10-CM

## 2017-05-01 DIAGNOSIS — M542 Cervicalgia: Secondary | ICD-10-CM

## 2017-05-01 MED ORDER — PREDNISONE 10 MG PO TABS: ORAL_TABLET | ORAL | 0 refills | Status: DC

## 2017-05-01 NOTE — Patient Instructions (Signed)
1.  We will get MRI of cervical spine and to include the upper thoracic region. 2.  Try the prednisone taper to see if it breaks the dizziness.  Take 6tabs x1day, then 5tabs x1day, then 4tabs x1day, then 3tabs x1day, then 2tabs x1day, then 1tab x1day, then STOP 3.  Follow up with Dr. Dorma RussellKraus.  If he does not feel it is related to the inner ear or endolyphatic hydrops, then contact me and we can try a trial of topiramate.

## 2017-05-02 ENCOUNTER — Encounter: Payer: Self-pay | Admitting: Neurology

## 2017-05-02 NOTE — Progress Notes (Signed)
NEUROLOGY FOLLOW UP OFFICE NOTE  Tanya Bush [id number]  HISTORY OF PRESENT ILLNESS: Tanya Bush is a 42 year old right-handed female with who follows up for new symptoms, headache, vertigo, myalgia, fatigue.  Imaging of brain/internal auditory canal MRI and MRA personally reviewed.  UPDATE: She was last seen in May 2017.  She was referred to Dr. Rosita Fire of ENT and was diagnosed with Meniere's.  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  She was prescribed Hygroton 25mg  daily with Micro-K 10 mEq daily.  The dizziness resolved.  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  Since then, she would experience brief (seconds) dizziness with change in position.  Two weeks ago, she began experiencing these brief dizzy spells more frequently.  She began experiencing pressure in the suboccipital region again.  Then last Friday she woke up with severe dizziness.  It is not a spinning sensation but rather a sensation of an elevator quickly descending.  It is fairly constant and aggravated by position but it is not only positional.  Looking up or down aggravates it.  She also reports pain in the upper thoracic region with numbness and tingling radiating across the upper back.    HISTORY: On the evening of 10/08/14, she developed severe throbbing pain on both sides of the neck, left worse than right, and radiating up to the bilateral posterior and parietal head and down to the upper back.  Moving neck in all directions exacerbates it, but mostly neck turn to left and side-bend to right.  Laying supine or on side in bed caused increased pain.  On 10/10/14, she saw her PCP.  She was prescribed Flexeril 10mg  and Naproxen 375mg , which have not been too effective.  She presented to the ED on 10/13/14 for worsening pain, as well as numbness and tingling on the left from the jaw line down to the rhomboids.  There was no associated  weakness or numbness involving the arm or hand.  CT of head and cervical spine were unremarkable.  She received a Toradol injection and was discharged with Vicodin.  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  She denied any fall, neck injury or strenuous activity preceding onset of the pain.  She was treated by Dr. Antoine Primas of Sports Medicine and headaches resolved.  In December 2016-January 2017 she developed a cold, consisting of nasal congestion.  That February, she developed fatigue and myalgias (mostly involving thighs).  She denied fever.  She went to her PCP where she was diagnosed with the flu and was treated with Tamiflu.  The symptoms would wax and wane, with alternating weeks of feeling okay.  She also noted left sided sore throat, ringing in the ears, weight loss.  She was found to have "fluid" in her left ear.  Lab work from 05/15/15 showed WBC of 10.5 with neutrophils of 77%.  CMP was normal.  CRP was 3.9, ANA negative, but Sed rate was mildly elevated at 44.  Thyroid panel was negative.  Vitamin D was 31.6 and Lyme was negative.  Repeat CBC on 06/02/15 was unremarkable with WBC of 8.  Sed rate was normal at 12.  In the last week of March, she was riding in a car.  She reached over and developed sudden onset of vertigo,  described as spinning and was positional.  She received the Epley maneuver, which helped, but she continues to feel a heavy sensation of dizziness described as a "swimmy feeling".  It is left sided and constant.  Then over the past week, she developed a new posterior headache.  At night, it would wake her up and she would note a pounding headache in the back of her head, radiating to the left temple.  It would be less intense during the day.  MRI of brain and internal auditory canals with and without contrast and MRA of head from 06/27/15 were normal.  She was  evaluated by Dr. Haroldine Lawsrossley, ENT on 07/21/15.  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    She has history of menstrual migraines.  She reported an episode of numbness and tingling of the extremities, fatigue, weakness in hands and halitosis in 2007 following the birth of her first child.  She had a workup for MS, including MRIs which were negative.  She notes transient blurred vision.  PAST MEDICAL HISTORY: Past Medical History:  Diagnosis Date  . Headache     MEDICATIONS: Current Outpatient Medications on File Prior to Visit  Medication Sig Dispense Refill  . levonorgestrel (MIRENA) 20 MCG/24HR IUD 1 each by Intrauterine route continuous.      No current facility-administered medications on file prior to visit.     ALLERGIES: No Known Allergies  FAMILY HISTORY: Family History  Problem Relation Age of Onset  . Thyroid disease Father   . Hypertension Father   . Stroke Father   . Thyroid disease Mother   . Osteoarthritis Maternal Grandmother   . Cancer Maternal Grandfather        colon  . Cancer Paternal Grandmother        unknown     SOCIAL HISTORY: Social History   Socioeconomic History  . Marital status: Married    Spouse name: Not on file  . Number of children: Not on file  . Years of education: Not on file  . Highest education level: Not on file  Social Needs  . Financial resource strain: Not on file  . Food insecurity - worry: Not on file  . Food insecurity - inability: Not on file  . Transportation needs - medical: Not on file  . Transportation needs - non-medical: Not on file  Occupational History  . Not on file  Tobacco Use  . Smoking status: Former Games developermoker  . Smokeless tobacco: Never Used  Substance and Sexual Activity  . Alcohol use: Yes    Alcohol/week: 0.0 oz    Comment: ocassionally weekend  . Drug use: No  . Sexual activity: Yes    Partners: Male    Birth control/protection: IUD     Comment: Mirena inserted 08/2005  Other Topics Concern  . Not on file  Social History Narrative  . Not on file    REVIEW OF SYSTEMS: Constitutional: No fevers, chills, or sweats, no generalized fatigue, change in appetite Eyes: No visual changes, double vision, eye pain Ear, nose and throat: as above Cardiovascular: No chest pain, palpitations Respiratory:  No shortness of breath at rest or with exertion, wheezes GastrointestinaI: No nausea, vomiting, diarrhea, abdominal pain, fecal incontinence Genitourinary:  No dysuria, urinary retention or frequency Musculoskeletal:  Neck pain Integumentary: No rash, pruritus, skin lesions Neurological: as above Psychiatric: No depression, insomnia, anxiety Endocrine: No palpitations, fatigue, diaphoresis, mood swings, change in appetite, change in weight, increased thirst Hematologic/Lymphatic:  No purpura, petechiae. Allergic/Immunologic: no itchy/runny eyes, nasal congestion, recent allergic reactions, rashes  PHYSICAL EXAM: Vitals:   05/01/17 1129  BP: 104/70  Pulse: 72  SpO2: 98%   General: No acute distress.  Patient appears well-groomed.  normal body habitus. Head:  Normocephalic/atraumatic Eyes:  Fundi examined but not visualized Neck: supple, no paraspinal tenderness, full range of motion Heart:  Regular rate and rhythm Lungs:  Clear to auscultation bilaterally Back: No paraspinal tenderness Neurological Exam: alert and oriented to person, place, and time. Attention span and concentration intact, recent and remote memory intact, fund of knowledge intact.  Speech fluent and not dysarthric, language intact.  CN II-XII intact. Bulk and tone normal, muscle strength 5/5 throughout.  Sensation to light touch, temperature and vibration intact.  Deep tendon reflexes 2+ throughout, toes downgoing.  Finger to nose and heel to shin testing intact.  Gait normal, Romberg negative.  IMPRESSION: Dizziness with progressive bilateral low-frequency  hearing loss Suboccipital headache/cervicogenic headache  Etiology of dizziness unclear.  If dizziness lasted up to 1 to 2 days, then vestibular migraine would be possible.  As the duration is ongoing for almost a week, vestibular migraine seems unlikely.  It may be secondary to the endolymphatic hydrops.  Regarding both the dizziness and posterior headache, I do not suspect they are part of a single entity.  One might be aggravating the other.  PLAN: 1.  I will prescribe her a prednisone taper to try and break this dizziness. 2.  I also recommended following up with Dr. Dorma Russell to see if her current dizziness is related to her inner ear pathology.  If this is not suspected, then we can start a trial of topiramate. 3.  It may be a good idea to consider seeing Dr. Katrinka Blazing again for her neck pain as treatment with OMT was previously effective. 4.  Due to persistent neck problems and to see if the dizziness is cervicogenic in nature, we will obtain MRI of cervical spine from C1 and try to get as much of the upper thoracic spine as possible.  25 minutes spent face to face with patient, over 50% spent discussing possible diagnoses and management.  Shon Millet, DO  CC:  Juluis Rainier, MD

## 2017-05-10 ENCOUNTER — Ambulatory Visit
Admission: RE | Admit: 2017-05-10 | Discharge: 2017-05-10 | Disposition: A | Discharge status: Home / Self Care | Payer: 59 | Source: Ambulatory Visit | Attending: Neurology | Admitting: Neurology

## 2017-05-10 DIAGNOSIS — M542 Cervicalgia: Secondary | ICD-10-CM

## 2017-05-12 ENCOUNTER — Telehealth: Payer: Self-pay | Admitting: Neurology

## 2017-05-12 NOTE — Telephone Encounter (Signed)
Please call.

## 2017-05-12 NOTE — Telephone Encounter (Signed)
Patient notified of results.

## 2017-05-12 NOTE — Telephone Encounter (Signed)
-----   Message from Drema DallasAdam R Jaffe, DO sent at 05/12/2017  1:46 PM EDT ----- MRI of cervical spine looks okay (able to see up to T2-3 level).

## 2017-05-16 ENCOUNTER — Telehealth: Payer: Self-pay | Admitting: Neurology

## 2017-05-16 MED ORDER — TOPIRAMATE 50 MG PO TABS: ORAL_TABLET | ORAL | 0 refills | Status: DC

## 2017-05-16 NOTE — Telephone Encounter (Signed)
Dr. Everlena CooperJaffe spoke with patient he would like to do a f/u in two months see his note regarding this

## 2017-05-16 NOTE — Telephone Encounter (Signed)
Patient called to see if Dr. Everlena CooperJaffe has talked with Dr. Tia MaskerKrouse regarding her starting Topamax? Please Call. Thanks

## 2017-05-16 NOTE — Telephone Encounter (Signed)
Yes, she may take magnesium with the topiramate.

## 2017-05-16 NOTE — Telephone Encounter (Signed)
Patient notified

## 2017-05-16 NOTE — Telephone Encounter (Signed)
Spoke with Bed Bath & BeyondKelly.  She saw Dr. Dorma RussellKraus of ENT.  I told her that I spoke to Dr. Dorma RussellKraus and that her symptoms may be migraine.  We will start topiramate 50mg  at bedtime for 1 week, then increase to 100mg  at bedtime.  Side effects discussed.  I will see her in about 2 months.

## 2017-05-16 NOTE — Telephone Encounter (Signed)
Please advise 

## 2017-05-16 NOTE — Telephone Encounter (Signed)
°     What is your medication issue? Spoke with patient she is scheduled to follow up in may. Can Magnesium supplements as well as others be taken with the new medication? Please Advise.

## 2017-05-30 ENCOUNTER — Telehealth: Payer: Self-pay | Admitting: Neurology

## 2017-05-30 NOTE — Telephone Encounter (Signed)
Patient lmom returning your call. Thanks  °

## 2017-05-30 NOTE — Telephone Encounter (Signed)
Spoke with Pt. She wants to know if ok to try Topamax 1 qam; 1 qpm vs 2 qhs. Advsd her that was ok. Pt concerned with burning sensation she is having at T 1-2 area. Cervical spine MRI did include that area and no abnormalities were seen. Pt wondering if could be shingles, but wants to try P/T again from Dr. Katrinka BlazingSmith and maybe dry needling. She will call back if symptoms worsen.

## 2017-05-30 NOTE — Telephone Encounter (Signed)
Patient called and was needing to speak with Dr. Everlena CooperJaffe regarding the medications that she is on. Also, she said she has various questions for him? She was asking could she do a phone visit? Please Call. Thanks

## 2017-05-30 NOTE — Telephone Encounter (Signed)
LM for Pt to call back with medication questions, also advising we do not do tlephone visits.

## 2017-06-13 ENCOUNTER — Other Ambulatory Visit: Payer: Self-pay | Admitting: Neurology

## 2017-06-26 ENCOUNTER — Encounter: Payer: Self-pay | Admitting: Neurology

## 2017-07-16 ENCOUNTER — Other Ambulatory Visit: Payer: Self-pay | Admitting: Neurology

## 2017-07-25 ENCOUNTER — Encounter

## 2017-07-25 ENCOUNTER — Ambulatory Visit: Payer: 59 | Admitting: Neurology

## 2017-07-29 ENCOUNTER — Other Ambulatory Visit: Payer: 59

## 2017-07-29 ENCOUNTER — Ambulatory Visit (INDEPENDENT_AMBULATORY_CARE_PROVIDER_SITE_OTHER): Payer: 59 | Admitting: Neurology

## 2017-07-29 ENCOUNTER — Encounter

## 2017-07-29 ENCOUNTER — Encounter: Payer: Self-pay | Admitting: Neurology

## 2017-07-29 VITALS — BP 94/64 | HR 72 | Ht 64.0 in | Wt 146.0 lb

## 2017-07-29 DIAGNOSIS — R5383 Other fatigue: Secondary | ICD-10-CM

## 2017-07-29 DIAGNOSIS — Z1321 Encounter for screening for nutritional disorder: Secondary | ICD-10-CM

## 2017-07-29 DIAGNOSIS — R202 Paresthesia of skin: Secondary | ICD-10-CM

## 2017-07-29 DIAGNOSIS — M791 Myalgia, unspecified site: Secondary | ICD-10-CM | POA: Diagnosis not present

## 2017-07-29 DIAGNOSIS — R42 Dizziness and giddiness: Secondary | ICD-10-CM | POA: Diagnosis not present

## 2017-07-29 NOTE — Patient Instructions (Addendum)
1.  Try stopping the topiramate to see if fatigue improves.  If vertigo or visual symptoms return, you may restart it or contact me for an alternative medication (such as an antidepressant) 2.  We will check ANA, Sed Rate, CRP, Lyme, B12, thyroid panel, CK, Vitamin D. Your provider has requested that you have labwork completed today. Please go to [facility] ([id number]) on the second floor of this building before leaving the office today. You do not need to check in. If you are not called within 15 minutes please check with the front desk.  3.  We will order NCV-EMG of lower extremities 4.  Follow up in 3 months

## 2017-07-30 LAB — TSH+T4F+T3FREE
Free T4: 1.24 ng/dL (ref 0.82–1.77)
T3, Free: 3 pg/mL (ref 2.0–4.4)
TSH: 1.69 u[IU]/mL (ref 0.450–4.500)

## 2017-07-30 LAB — C-REACTIVE PROTEIN: CRP: 1.4 mg/L (ref <8.0)

## 2017-07-30 NOTE — Progress Notes (Signed)
NEUROLOGY FOLLOW UP OFFICE NOTE  Tanya Bush [id number]  HISTORY OF PRESENT ILLNESS: Tanya Bush is a 42 year old right-handed female with who follows up for new symptoms, headache, vertigo, myalgia, fatigue.  Imaging of brain/internal auditory canal MRI and MRA personally reviewed.   UPDATE:  Due to suboccipital headache and neck pain, she had an MRI of the cervical spine performed on 05/10/17, which was personally reviewed and was unremarkable.  She followed up with Dr. Dorma Russell of ENT, who believed her symptoms now may be migraine.  She was started on topiramate.   at bedtime and  twice daily both caused drowsiness, so she remained on  at bedtime.  She is not having the vertigo too much.  She may have a mild disequilibrium.  However, she has had an exacerbation of chronic fatigue.  She started experiencing myalgias.  She reports dysesthesias and numbness in her feet.  It is different than the paresthesias she feels in her fingertips and face due to the topiramate.  These are similar symptoms that she experienced 2 years ago.  She continues to have the burning pain in the upper thoracic region.  It is very distressing.  HISTORY: On the evening of 10/08/14, she developed severe throbbing pain on both sides of the neck, left worse than right, and radiating up to the bilateral posterior and parietal head and down to the upper back.  Moving neck in all directions exacerbates it, but mostly neck turn to left and side-bend to right.  Laying supine or on side in bed caused increased pain.  On 10/10/14, she saw her PCP.  She was prescribed Flexeril  and Naproxen , which have not been too effective.  She presented to the ED on 10/13/14 for worsening pain, as well as numbness and tingling on the left from the jaw line down to the rhomboids.  There was no associated weakness or numbness involving the arm or hand.  CT of head and cervical spine were unremarkable.  She received a Toradol  injection and was discharged with Vicodin.  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  She denied any fall, neck injury or strenuous activity preceding onset of the pain.  She was treated by Dr. Antoine Primas of Sports Medicine and headaches resolved.   In December 2016-January 2017 she developed a cold, consisting of nasal congestion.  That February, she developed fatigue and myalgias (mostly involving thighs).  She denied fever.  She went to her PCP where she was diagnosed with the flu and was treated with Tamiflu.  The symptoms would wax and wane, with alternating weeks of feeling okay.  She also noted left sided sore throat, ringing in the ears, weight loss.  She was found to have "fluid" in her left ear.  Lab work from 05/15/15 showed WBC of 10.5 with neutrophils of 77%.  CMP was normal.  CRP was 3.9, ANA negative, but Sed rate was mildly elevated at 44.  Thyroid panel was negative.  Vitamin D was 31.6 and Lyme was negative.  Repeat CBC on 06/02/15 was unremarkable with WBC of 8.  Sed rate was normal at 12.  In the last week of March, she was riding in a car.  She reached over and developed sudden onset of vertigo, described as spinning and was positional.  She received the Epley maneuver, which helped, but she continues to  feel a heavy sensation of dizziness described as a "swimmy feeling".  It is left sided and constant.  Then over the past week, she developed a new posterior headache.  At night, it would wake her up and she would note a pounding headache in the back of her head, radiating to the left temple.  It would be less intense during the day.  MRI of brain and internal auditory canals with and without contrast and MRA of head from 06/27/15 were normal.  She was referred to Dr. Rosita Fire of ENT and was diagnosed with Meniere's.  She got a second opinion by Dr. Dorma Russell who diagnosed her with  endolymphatic hydrops.  She was prescribed Hygroton  daily with Micro-K 10 mEq daily.  The dizziness resolved.  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  Since then, she would experience brief (seconds) dizziness with change in position.  In early 2019, she began experiencing these brief dizzy spells more frequently.  She began experiencing pressure in the suboccipital region again.  In February 2019, she woke up with severe dizziness.  It was not a spinning sensation but rather a sensation of an elevator quickly descending.  It is fairly constant and aggravated by position but it is not only positional.  Looking up or down aggravates it.  She also reports pain in the upper thoracic region with numbness and tingling radiating across the upper back.     She has history of menstrual migraines.  She reported an episode of numbness and tingling of the extremities, fatigue, weakness in hands and halitosis in 2007 following the birth of her first child.  She had a workup for MS, including MRIs which were negative.  She notes transient blurred vision.   PAST MEDICAL HISTORY: Past Medical History:  Diagnosis Date  . Headache     MEDICATIONS: Current Outpatient Medications on File Prior to Visit  Medication Sig Dispense Refill  . magnesium 30 MG tablet Take 30 mg by mouth daily.    Marland Kitchen levonorgestrel (MIRENA) 20 MCG/24HR IUD 1 each by Intrauterine route continuous.     . predniSONE (DELTASONE) 10 MG tablet Take 6tabs x1day, then 5tabs x1day, then 4tabs x1day, then 3tabs x1day, then 2tabs x1day, then 1tab x1day, then STOP (Patient not taking: Reported on 07/29/2017) 21 tablet 0  . topiramate (TOPAMAX) 50 MG tablet TAKE 1 TABLET AT BEDTIME FOR ONE WEEK, THEN INCREASE TO 2 TABLETS AT BEDTIME 60 tablet 5   No current facility-administered medications on file prior to visit.     ALLERGIES: No Known Allergies  FAMILY HISTORY: Family History  Problem  Relation Age of Onset  . Thyroid disease Father   . Hypertension Father   . Stroke Father   . Thyroid disease Mother   . Osteoarthritis Maternal Grandmother   . Cancer Maternal Grandfather        colon  . Cancer Paternal Grandmother        unknown     SOCIAL HISTORY: Social History   Socioeconomic History  . Marital status: Married    Spouse name: Not on file  . Number of children: Not on file  . Years of education: Not on file  . Highest education level: Not on file  Occupational History  . Not on file  Social Needs  . Financial resource strain: Not on file  . Food insecurity:    Worry: Not on file    Inability: Not on file  .  Transportation needs:    Medical: Not on file    Non-medical: Not on file  Tobacco Use  . Smoking status: Former Games developer  . Smokeless tobacco: Never Used  Substance and Sexual Activity  . Alcohol use: Yes    Alcohol/week: 0.0 oz    Comment: ocassionally weekend  . Drug use: No  . Sexual activity: Yes    Partners: Male    Birth control/protection: IUD    Comment: Mirena inserted 08/2005  Lifestyle  . Physical activity:    Days per week: Not on file    Minutes per session: Not on file  . Stress: Not on file  Relationships  . Social connections:    Talks on phone: Not on file    Gets together: Not on file    Attends religious service: Not on file    Active member of club or organization: Not on file    Attends meetings of clubs or organizations: Not on file    Relationship status: Not on file  . Intimate partner violence:    Fear of current or ex partner: Not on file    Emotionally abused: Not on file    Physically abused: Not on file    Forced sexual activity: Not on file  Other Topics Concern  . Not on file  Social History Narrative  . Not on file    REVIEW OF SYSTEMS: Constitutional: fatigue Eyes: No visual changes, double vision, eye pain Ear, nose and throat: No hearing loss, ear pain, nasal congestion, sore  throat Cardiovascular: No chest pain, palpitations Respiratory:  No shortness of breath at rest or with exertion, wheezes GastrointestinaI: No nausea, vomiting, diarrhea, abdominal pain, fecal incontinence Genitourinary:  No dysuria, urinary retention or frequency Musculoskeletal:  myalgias Integumentary: No rash, pruritus, skin lesions Neurological: as above Psychiatric: anxiety Endocrine: No palpitations, fatigue, diaphoresis, mood swings, change in appetite, change in weight, increased thirst Hematologic/Lymphatic:  No purpura, petechiae. Allergic/Immunologic: no itchy/runny eyes, nasal congestion, recent allergic reactions, rashes  PHYSICAL EXAM: Vitals:   07/29/17 1542  BP: 94/64  Pulse: 72  SpO2: 99%   General: No acute distress.  Patient appears well-groomed.  Head:  Normocephalic/atraumatic Eyes:  Fundi examined but not visualized Neck: supple, no paraspinal tenderness, full range of motion Heart:  Regular rate and rhythm Lungs:  Clear to auscultation bilaterally Back: No paraspinal tenderness Neurological Exam: alert and oriented to person, place, and time. Attention span and concentration intact, recent and remote memory intact, fund of knowledge intact.  Speech fluent and not dysarthric, language intact.  CN II-XII intact. Bulk and tone normal, muscle strength 5/5 throughout.  Sensation to pinprick reduced in the toes bilaterally.  Vibration sensation intact.  Deep tendon reflexes 2+ throughout.  Finger to nose testing intact.  Gait normal, Romberg negative.  IMPRESSION: 1.  Dizziness.  Vertigo is improved.  Etiology unclear.  Vestibular migraine entertained.  There was originally a component of BPPV. 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   Persistent postural-perceptual dizziness is also considered. 2.  She has constellation of other symptoms, including chronic fatigue, myalgias, paresthesias.  Symptoms have been ongoing  for a couple of years but worse over the past 2 months.  Exacerbation possibly secondary to topiramate.  Underlying autoimmune disease possible or somatiform.  PLAN: 1.  Try stopping the topiramate to see if fatigue improves.  If vertigo or visual symptoms return, you may restart it or contact me for an alternative medication (  such as an antidepressant) 2.  We will check ANA, Sed Rate, CRP, Lyme, B12, thyroid panel, CK, Vitamin D. Your provider has requested that you have labwork completed today. Please go to [facility] ([id number]) on the second floor of this building before leaving the office today. You do not need to check in. If you are not called within 15 minutes please check with the front desk.  3.  We will order NCV-EMG of lower extremities 4.  Follow up in 3 months  26 minutes spent face to face with patient, over 50% spent discussing management and possible diagnoses.  Shon Millet, DO  CC:  Dr. Zachery Dauer

## 2017-07-31 LAB — LYME, IGM, EARLY TEST/REFLEX: LYME DISEASE AB, QUANT, IGM: <0.8 index (ref 0.00–0.79)

## 2017-08-04 LAB — CK: Total CK: 40 U/L (ref 29–143)

## 2017-08-04 LAB — ANTI-NUCLEAR AB-TITER (ANA TITER)

## 2017-08-04 LAB — VITAMIN D 1,25 DIHYDROXY
VITAMIN D 1, 25 (OH) TOTAL: 51 pg/mL (ref 18–72)
Vitamin D2 1, 25 (OH)2: <8 pg/mL
Vitamin D3 1, 25 (OH)2: 51 pg/mL

## 2017-08-04 LAB — VITAMIN B12: VITAMIN B 12: 458 pg/mL (ref 200–1100)

## 2017-08-04 LAB — SEDIMENTATION RATE: SED RATE: 6 mm/h (ref 0–20)

## 2017-08-04 LAB — ANA: Anti Nuclear Antibody(ANA): POSITIVE — AB

## 2017-08-06 ENCOUNTER — Telehealth: Payer: Self-pay

## 2017-08-06 NOTE — Telephone Encounter (Signed)
-----   Message from Adam R Jaffe, DO sent at 08/05/2017  9:00 AM EDT ----- All labs are normal except she has a positive ANA, which is nonspecific but may indicate an autoimmune/rheumatologic condition.  Given this finding, and her myalgias, I would like to refer her to rheumatology. 

## 2017-08-06 NOTE — Telephone Encounter (Signed)
Called and spoke with Pt. She states [facility] and she will call to make appointment.  Pt does not wish to go forward with EMG at this time.

## 2017-08-06 NOTE — Telephone Encounter (Signed)
-----   Message from Drema DallasAdam R Jaffe, DO sent at 08/05/2017  9:00 AM EDT ----- All labs are normal except she has a positive ANA, which is nonspecific but may indicate an autoimmune/rheumatologic condition.  Given this finding, and her myalgias, I would like to refer her to rheumatology.

## 2017-08-06 NOTE — Telephone Encounter (Signed)
Called Pt, LMOVM for rtrn call 

## 2017-08-07 ENCOUNTER — Telehealth: Payer: Self-pay

## 2017-08-07 NOTE — Telephone Encounter (Signed)
Rcvd VM from Pt. She requests we send labs to Dr Zenovia [facility]ela Hawkes[facility]. Sent labs

## 2017-08-08 ENCOUNTER — Telehealth: Payer: Self-pay | Admitting: Neurology

## 2017-08-08 NOTE — Telephone Encounter (Signed)
Pt Tanya Bush for me to call her to clarify questions she has. Called and spoke with Pt. She wanted to make sure it was ok to take topiramate with positive ANA, advised her yes. She wanted levels from labs, went over that with her. She was questioning going to see Rheumatologist. She doesn't feel her symptoms are rheumatologic. I advised her in Dr Moises BloodJaffe's professional opinion, and since she mentioned aching, a rheumatologist would be the logical option. I also advised her she could contact her PCP if she felt like she should see another type of specialist first.  Pt will see rheumatologist and go from there. I am mailing a copy of labs to her.

## 2017-08-08 NOTE — Telephone Encounter (Signed)
Patient called and would like a copy of her results as well. She has a few questions for you as well. Thanks

## 2018-03-27 DIAGNOSIS — Z1231 Encounter for screening mammogram for malignant neoplasm of breast: Secondary | ICD-10-CM | POA: Diagnosis not present

## 2018-03-27 DIAGNOSIS — Z124 Encounter for screening for malignant neoplasm of cervix: Secondary | ICD-10-CM | POA: Diagnosis not present

## 2018-03-27 DIAGNOSIS — Z6826 Body mass index (BMI) 26.0-26.9, adult: Secondary | ICD-10-CM | POA: Diagnosis not present

## 2018-03-27 DIAGNOSIS — Z01419 Encounter for gynecological examination (general) (routine) without abnormal findings: Secondary | ICD-10-CM | POA: Diagnosis not present

## 2018-05-08 DIAGNOSIS — G43109 Migraine with aura, not intractable, without status migrainosus: Secondary | ICD-10-CM | POA: Diagnosis not present

## 2018-05-08 DIAGNOSIS — H903 Sensorineural hearing loss, bilateral: Secondary | ICD-10-CM | POA: Diagnosis not present

## 2018-05-26 DIAGNOSIS — R5383 Other fatigue: Secondary | ICD-10-CM | POA: Diagnosis not present

## 2018-05-26 DIAGNOSIS — E559 Vitamin D deficiency, unspecified: Secondary | ICD-10-CM | POA: Diagnosis not present

## 2018-05-26 DIAGNOSIS — B2790 Infectious mononucleosis, unspecified without complication: Secondary | ICD-10-CM | POA: Diagnosis not present

## 2018-08-14 DIAGNOSIS — D2362 Other benign neoplasm of skin of left upper limb, including shoulder: Secondary | ICD-10-CM | POA: Diagnosis not present

## 2018-08-14 DIAGNOSIS — D2262 Melanocytic nevi of left upper limb, including shoulder: Secondary | ICD-10-CM | POA: Diagnosis not present

## 2018-08-14 DIAGNOSIS — D2272 Melanocytic nevi of left lower limb, including hip: Secondary | ICD-10-CM | POA: Diagnosis not present

## 2018-10-27 DIAGNOSIS — M25531 Pain in right wrist: Secondary | ICD-10-CM | POA: Diagnosis not present

## 2018-11-03 DIAGNOSIS — M25531 Pain in right wrist: Secondary | ICD-10-CM | POA: Diagnosis not present

## 2018-11-13 DIAGNOSIS — R5381 Other malaise: Secondary | ICD-10-CM | POA: Diagnosis not present

## 2018-11-13 DIAGNOSIS — R42 Dizziness and giddiness: Secondary | ICD-10-CM | POA: Diagnosis not present

## 2018-11-13 DIAGNOSIS — D519 Vitamin B12 deficiency anemia, unspecified: Secondary | ICD-10-CM | POA: Diagnosis not present

## 2018-11-13 DIAGNOSIS — M791 Myalgia, unspecified site: Secondary | ICD-10-CM | POA: Diagnosis not present

## 2019-02-05 DIAGNOSIS — F419 Anxiety disorder, unspecified: Secondary | ICD-10-CM | POA: Diagnosis not present

## 2019-02-05 DIAGNOSIS — F339 Major depressive disorder, recurrent, unspecified: Secondary | ICD-10-CM | POA: Diagnosis not present

## 2019-04-02 DIAGNOSIS — D239 Other benign neoplasm of skin, unspecified: Secondary | ICD-10-CM | POA: Diagnosis not present

## 2019-04-02 DIAGNOSIS — L710 Perioral dermatitis: Secondary | ICD-10-CM | POA: Diagnosis not present

## 2019-04-02 DIAGNOSIS — L821 Other seborrheic keratosis: Secondary | ICD-10-CM | POA: Diagnosis not present

## 2019-04-26 DIAGNOSIS — R350 Frequency of micturition: Secondary | ICD-10-CM | POA: Diagnosis not present

## 2019-05-21 DIAGNOSIS — Z1231 Encounter for screening mammogram for malignant neoplasm of breast: Secondary | ICD-10-CM | POA: Diagnosis not present

## 2019-05-21 DIAGNOSIS — R339 Retention of urine, unspecified: Secondary | ICD-10-CM | POA: Diagnosis not present

## 2019-05-21 DIAGNOSIS — Z6825 Body mass index (BMI) 25.0-25.9, adult: Secondary | ICD-10-CM | POA: Diagnosis not present

## 2019-05-21 DIAGNOSIS — Z1239 Encounter for other screening for malignant neoplasm of breast: Secondary | ICD-10-CM | POA: Diagnosis not present

## 2019-05-21 DIAGNOSIS — Z113 Encounter for screening for infections with a predominantly sexual mode of transmission: Secondary | ICD-10-CM | POA: Diagnosis not present

## 2019-05-21 DIAGNOSIS — Z01419 Encounter for gynecological examination (general) (routine) without abnormal findings: Secondary | ICD-10-CM | POA: Diagnosis not present

## 2019-06-15 DIAGNOSIS — N3281 Overactive bladder: Secondary | ICD-10-CM | POA: Diagnosis not present

## 2019-06-15 DIAGNOSIS — N393 Stress incontinence (female) (male): Secondary | ICD-10-CM | POA: Diagnosis not present

## 2019-07-05 DIAGNOSIS — R350 Frequency of micturition: Secondary | ICD-10-CM | POA: Diagnosis not present

## 2019-07-05 DIAGNOSIS — N3281 Overactive bladder: Secondary | ICD-10-CM | POA: Diagnosis not present

## 2019-07-06 DIAGNOSIS — Z30430 Encounter for insertion of intrauterine contraceptive device: Secondary | ICD-10-CM | POA: Diagnosis not present

## 2019-07-06 DIAGNOSIS — Z30433 Encounter for removal and reinsertion of intrauterine contraceptive device: Secondary | ICD-10-CM | POA: Diagnosis not present

## 2019-07-06 DIAGNOSIS — Z30432 Encounter for removal of intrauterine contraceptive device: Secondary | ICD-10-CM | POA: Diagnosis not present

## 2019-08-03 DIAGNOSIS — Z30431 Encounter for routine checking of intrauterine contraceptive device: Secondary | ICD-10-CM | POA: Diagnosis not present

## 2019-11-11 ENCOUNTER — Telehealth: Payer: Self-pay | Admitting: Neurology

## 2019-11-11 NOTE — Progress Notes (Signed)
NEUROLOGY FOLLOW UP OFFICE NOTE  Tanya Bush [id number]  HISTORY OF PRESENT ILLNESS: Tanya Bush is a 44 year old right-handed female last seen in 2019 presents today for new issue, left sided facial pain and numbness.  UPDATE: Last seen in 2019.  Overall, dizziness has resolved.  She may feel a little dizzy every once in awhile.  She had Covid in January.  During that time, she developed left sided facial pain.  She experienced severe sharp pain in the left temple, anterior to the left ear, and down along the left side of her jaw.  She had no corresponding rash.  Sometimes eating may aggravate it but not talking or brushing her teeth.  Occasionally the pain may radiate to the left occipital region.  She also experienced aching in the left side of her jaw as well as tightness in the left side of her neck.  She also developed left sided facial numbness. Prior to Covid, she may have brief left sided jaw pain when eating, but it was infrequent.  Symptoms lasted a couple of weeks and resolved.  She had been doing fine until last Thursday when the symptoms returned.  After several days, she had to finally call out of work due to the pain.  Today is the first day that she feels relief.  When she touches her skin just anterior to her ear, it feels numb.  No visual symptoms, facial droop, slurred speech or extremity symptoms.  Between these two events, she has been to the dentist.  She has crowns which were reportedly fine.   HISTORY: On the evening of 10/08/14, she developed severe throbbing pain on both sides of the neck, left worse than right, and radiating up to the bilateral posterior and parietal head and down to the upper back.  Moving neck in all directions exacerbates it, but mostly neck turn to left and side-bend to right.  Laying supine or on side in bed caused increased pain.  On 10/10/14, she saw her PCP.  She was prescribed Flexeril 10mg  and Naproxen 375mg , which have not been too  effective.  She presented to the ED on 10/13/14 for worsening pain, as well as numbness and tingling on the left from the jaw line down to the rhomboids.  There was no associated weakness or numbness involving the arm or hand.  CT of head and cervical spine were unremarkable.  She received a Toradol injection and was discharged with Vicodin.  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  She denied any fall, neck injury or strenuous activity preceding onset of the pain.  She was treated by Dr. 12/13/14 of Sports Medicine and headaches resolved.  In December 2016-January 2017 she developed a cold, consisting of nasal congestion. That February, she developed fatigue and myalgias (mostly involving thighs). She denied fever. She went to her PCP where she was diagnosed with the flu and was treated with Tamiflu. The symptoms would wax and wane, with alternating weeks of feeling okay. She also noted left sided sore throat, ringing in the ears, weight loss. She was found to have "fluid" in her left ear. Lab work from 05/15/15 showed WBC of 10.5 with neutrophils of 77%. CMP was normal. CRP was 3.9, ANA negative, but Sed rate was mildly elevated at 44. Thyroid panel was negative. Vitamin D was 31.6 and Lyme was negative. Repeat CBC  on 06/02/15 was unremarkable with WBC of 8. Sed rate was normal at 12. In the last week of March, she was riding in a car. She reached over and developed sudden onset of vertigo, described as spinning and was positional. She received the Epley maneuver, which helped, but she continues to feel a heavy sensation of dizziness described as a "swimmy feeling". It is left sided and constant. Then over the past week, she developed a new posterior headache. At night, it would wake her up and she would note a pounding headache in the back of her head, radiating to the  left temple. It would be less intense during the day.  MRI of brain and internal auditory canals with and without contrast and MRA of head from 06/27/15 were normal.  She was referred to Dr. Rosita Firerowsley of ENT and was diagnosed with Meniere's.  She got a second opinion by Dr. Dorma RussellKraus who diagnosed her with endolymphatic hydrops.  She was prescribed Hygroton 25mg  daily with Micro-K 10 mEq daily.  The dizziness resolved.  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  Since then, she would experience brief (seconds) dizziness with change in position.  In early 2019, she began experiencing these brief dizzy spells more frequently.  She began experiencing pressure in the suboccipital region again.  In February 2019, she woke up with severe dizziness.  It was not a spinning sensation but rather a sensation of an elevator quickly descending.  It is fairly constant and aggravated by position but it is not only positional.  Looking up or down aggravates it.  She also reports pain in the upper thoracic region with numbness and tingling radiating across the upper back.  Due to suboccipital headache and neck pain, she had an MRI of the cervical spine performed on 05/10/17, which was personally reviewed and was unremarkable.  She followed up with Dr. Dorma RussellKraus of ENT, who believed her symptoms now may be migraine.  She was started on topiramate.  100mg  at bedtime and 50mg  twice daily both caused drowsiness, so she remained on 50mg  at bedtime.  She is not having the vertigo too much.  She may have a mild disequilibrium.  However, she has had an exacerbation of chronic fatigue.  She started experiencing myalgias.  She reports dysesthesias and numbness in her feet.  It is different than the paresthesias she feels in her fingertips and face due to the topiramate.  These are similar symptoms that she experienced 2 years ago.  She continues to have the burning pain in the upper thoracic  region.  It is very distressing.  Labs from May 2019 included a positive ANA with centromere pattern and 1:320 titer.  However other labs were unremarkable including sed rate 6, negative Lyme, CK 40, TSH 1.690, and B12 458.  NCV-EMG was ordered but patient never had it performed.  She has history of menstrual migraines.  She reported an episode of numbness and tingling of the extremities, fatigue, weakness in hands and halitosis in 2007 following the birth of her first child. She had a workup for MS, including MRIs which were negative. She notes transient blurred vision.  PAST MEDICAL HISTORY: Past Medical History:  Diagnosis Date  . Headache     MEDICATIONS: Current Outpatient Medications on File Prior to Visit  Medication Sig Dispense Refill  . levonorgestrel (MIRENA) 20 MCG/24HR IUD 1 each by Intrauterine route continuous.     . magnesium 30 MG  tablet Take 30 mg by mouth daily.    . predniSONE (DELTASONE) 10 MG tablet Take 6tabs x1day, then 5tabs x1day, then 4tabs x1day, then 3tabs x1day, then 2tabs x1day, then 1tab x1day, then STOP (Patient not taking: Reported on 07/29/2017) 21 tablet 0  . topiramate (TOPAMAX) 50 MG tablet TAKE 1 TABLET AT BEDTIME FOR ONE WEEK, THEN INCREASE TO 2 TABLETS AT BEDTIME 60 tablet 5   No current facility-administered medications on file prior to visit.    ALLERGIES: No Known Allergies  FAMILY HISTORY: Family History  Problem Relation Age of Onset  . Thyroid disease Father   . Hypertension Father   . Stroke Father   . Thyroid disease Mother   . Osteoarthritis Maternal Grandmother   . Cancer Maternal Grandfather        colon  . Cancer Paternal Grandmother        unknown     SOCIAL HISTORY: Social History   Socioeconomic History  . Marital status: Married    Spouse name: Not on file  . Number of children: Not on file  . Years of education: Not on file  . Highest education level: Not on file  Occupational History  . Not on file  Tobacco  Use  . Smoking status: Former Games developer  . Smokeless tobacco: Never Used  Vaping Use  . Vaping Use: Never used  Substance and Sexual Activity  . Alcohol use: Yes    Alcohol/week: 0.0 standard drinks    Comment: ocassionally weekend  . Drug use: No  . Sexual activity: Yes    Partners: Male    Birth control/protection: I.U.D.    Comment: Mirena inserted 08/2005  Other Topics Concern  . Not on file  Social History Narrative  . Not on file   Social Determinants of Health   Financial Resource Strain:   . Difficulty of Paying Living Expenses: Not on file  Food Insecurity:   . Worried About Programme researcher, broadcasting/film/video in the Last Year: Not on file  . Ran Out of Food in the Last Year: Not on file  Transportation Needs:   . Lack of Transportation (Medical): Not on file  . Lack of Transportation (Non-Medical): Not on file  Physical Activity:   . Days of Exercise per Week: Not on file  . Minutes of Exercise per Session: Not on file  Stress:   . Feeling of Stress : Not on file  Social Connections:   . Frequency of Communication with Friends and Family: Not on file  . Frequency of Social Gatherings with Friends and Family: Not on file  . Attends Religious Services: Not on file  . Active Member of Clubs or Organizations: Not on file  . Attends Banker Meetings: Not on file  . Marital Status: Not on file  Intimate Partner Violence:   . Fear of Current or Ex-Partner: Not on file  . Emotionally Abused: Not on file  . Physically Abused: Not on file  . Sexually Abused: Not on file    PHYSICAL EXAM: Blood pressure 118/71, pulse 84, resp. rate 20, height 5\' 4"  (1.626 m), weight 153 lb (69.4 kg), SpO2 99 %. General: No acute distress.  Patient appears well-groomed.   Head:  Normocephalic/atraumatic Eyes:  Fundi examined but not visualized Neck: supple, no paraspinal tenderness, full range of motion Heart:  Regular rate and rhythm Lungs:  Clear to auscultation bilaterally Back: No  paraspinal tenderness Neurological Exam: alert and oriented to person, place, and time. Attention  span and concentration intact, recent and remote memory intact, fund of knowledge intact.  Speech fluent and not dysarthric, language intact.  Mild decreased light touch sensation just anterior to the left ear.  Otherwise, CN II-XII intact. Bulk and tone normal, muscle strength 5/5 throughout.  Sensation to light touch, temperature and vibration intact.  Deep tendon reflexes 2+ throughout, toes downgoing.  Finger to nose and heel to shin testing intact.  Gait normal, Romberg negative.  IMPRESSION: Atypical left sided facial pain and numbness.  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    PLAN: 1.  Due to persistent facial numbness, I would like to check MRI of brain and left trigeminal nerve with and without contrast. 2.  As pain is subsiding, we will defer pharmacologic management.  If pain returns, consider oxcarbazepine. 3.  Follow up in 4 months.  Total time spent with patient and reviewing chart:  38 minutes  Shon Millet, DO  CC: Juluis Rainier, MD

## 2019-11-11 NOTE — Telephone Encounter (Signed)
Patient called and said, "I had Covid-19 in January and at the same time experienced ear and jaw pain on the left side of my face with some numbness. Now, it has come back. It's been going on since last Thursday. I'd like to see Dr. Everlena Cooper about this."  Patient last seen 07/29/17 for headaches. Please advise on whether Dr. Everlena Cooper will see this patient for this new problem.

## 2019-11-11 NOTE — Telephone Encounter (Signed)
Per dr. Everlena Cooper it is okay to see pt.

## 2019-11-12 ENCOUNTER — Other Ambulatory Visit: Payer: Self-pay

## 2019-11-12 ENCOUNTER — Ambulatory Visit (INDEPENDENT_AMBULATORY_CARE_PROVIDER_SITE_OTHER): Payer: BC Managed Care – PPO | Admitting: Neurology

## 2019-11-12 ENCOUNTER — Encounter: Payer: Self-pay | Admitting: Neurology

## 2019-11-12 VITALS — BP 118/71 | HR 84 | Resp 20 | Ht 64.0 in | Wt 153.0 lb

## 2019-11-12 DIAGNOSIS — R519 Headache, unspecified: Secondary | ICD-10-CM | POA: Diagnosis not present

## 2019-11-12 DIAGNOSIS — R200 Anesthesia of skin: Secondary | ICD-10-CM | POA: Diagnosis not present

## 2019-11-12 NOTE — Patient Instructions (Addendum)
It may be trigeminal neuralgia (possibly triggered by Covid).  Hopefully this flare up has run its course  1.  We will check MRI of brain and left trigeminal nerve with and without contrast. We have sent a referral to [facility] for your MRI and they will call you directly to schedule your appointment. They are located at [address] [facility]. If you need to contact them directly please call [phone].  2.  We will hold off on starting medication unless you have a recurrence of pain. 3.  Follow up in 4 months.  Contact me sooner if you have a recurrence of pain and we can start a medication.

## 2019-12-06 ENCOUNTER — Other Ambulatory Visit: Payer: Self-pay

## 2019-12-06 ENCOUNTER — Ambulatory Visit
Admission: RE | Admit: 2019-12-06 | Discharge: 2019-12-06 | Disposition: A | Discharge status: Home / Self Care | Payer: BC Managed Care – PPO | Source: Ambulatory Visit | Attending: Neurology | Admitting: Neurology

## 2019-12-06 DIAGNOSIS — R519 Headache, unspecified: Secondary | ICD-10-CM

## 2019-12-06 DIAGNOSIS — H748X2 Other specified disorders of left middle ear and mastoid: Secondary | ICD-10-CM | POA: Diagnosis not present

## 2019-12-06 DIAGNOSIS — R200 Anesthesia of skin: Secondary | ICD-10-CM | POA: Diagnosis not present

## 2019-12-06 DIAGNOSIS — G9389 Other specified disorders of brain: Secondary | ICD-10-CM | POA: Diagnosis not present

## 2019-12-06 MED ORDER — GADOBENATE DIMEGLUMINE 529 MG/ML IV SOLN
14.0000 mL | Freq: Once | INTRAVENOUS | Status: AC | PRN
  Administered 2019-12-06: 14 mL via INTRAVENOUS

## 2020-03-31 DIAGNOSIS — L578 Other skin changes due to chronic exposure to nonionizing radiation: Secondary | ICD-10-CM | POA: Diagnosis not present

## 2020-03-31 DIAGNOSIS — L818 Other specified disorders of pigmentation: Secondary | ICD-10-CM | POA: Diagnosis not present

## 2020-03-31 DIAGNOSIS — D2272 Melanocytic nevi of left lower limb, including hip: Secondary | ICD-10-CM | POA: Diagnosis not present

## 2020-03-31 DIAGNOSIS — D2262 Melanocytic nevi of left upper limb, including shoulder: Secondary | ICD-10-CM | POA: Diagnosis not present

## 2020-04-20 NOTE — Progress Notes (Signed)
Virtual Visit via Video Note The purpose of this virtual visit is to provide medical care while limiting exposure to the novel coronavirus.    Consent was obtained for video visit:  Yes.   Answered questions that patient had about telehealth interaction:  Yes.   I discussed the limitations, risks, security and privacy concerns of performing an evaluation and management service by telemedicine. I also discussed with the patient that there may be a patient responsible charge related to this service. The patient expressed understanding and agreed to proceed.  Pt location: Home Physician Location: office Name of referring provider:  Juluis RainierBarnes, Elizabeth, MD I connected with Tanya Bush at patients initiation/request on 04/21/2020 at 10:10 AM EST by video enabled telemedicine application and verified that I am speaking with the correct person using two identifiers. Pt MRN:  [id number] Pt DOB:  11-13-1975 Video Participants:  Tanya LawlessKelly J Bush   History of Present Illness:  Tanya EveryKelly Bush is a 45 year old right-handed female last seen in 2019 presents today for new issue, left sided facial pain and numbness.  UPDATE:  MRI of brain and trigeminal nerves with and without contrast on 12/06/2019 personally reviewed were normal.  She has been doing well up until 2 days ago.  She started feeling slight discomfort and numbness on the left side of her face and temple.  She also feels a tightness on the left side of her neck along the SCM.  So far, it hasn't progressed.  HISTORY: On the evening of 10/08/14, she developed severe throbbing pain on both sides of the neck, left worse than right, and radiating up to the bilateral posterior and parietal head and down to the upper back. Moving neck in all directions exacerbates it, but mostly neck turn to left and side-bend to right. Laying supine or on side in bed caused increased pain. On 10/10/14, she saw her PCP. She was prescribed Flexeril 10mg  and Naproxen  375mg , which have not been too effective. She presented to the ED on 10/13/14 for worsening pain, as well as numbness and tingling on the left from the jaw line down to the rhomboids. There was no associated weakness or numbness involving the arm or hand. CT of head and cervical spine were unremarkable. She received a Toradol injection and was discharged with Vicodin. 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 She denied any fall, neck injury or strenuous activity preceding onset of the pain. She was treated by Dr. Antoine PrimasZachary Smith of Sports Medicine and headaches resolved.  In December 2016-January 2017 she developed a cold, consisting of nasal congestion.That February, she developed fatigue and myalgias (mostly involving thighs).She denied fever.She went to her PCP where she was diagnosed with the flu and was treated with Tamiflu.The symptoms would wax and wane, with alternating weeks of feeling okay.She also noted left sided sore throat, ringing in the ears, weight loss.She was found to have "fluid" in her left ear.Lab work from 05/15/15 showed WBC of 10.5 with neutrophils of 77%.CMP was normal.CRP was 3.9, ANA negative, but Sed rate was mildly elevated at 44.Thyroid panel was negative.Vitamin D was 31.6 and Lyme was negative.Repeat CBC on 06/02/15 was unremarkable with WBC of 8.Sed rate was normal at 12.In the last week of March, she was riding in a car.She reached over and developed sudden onset of vertigo, described as spinning and was positional.She received the Epley  maneuver, which helped, but she continues to feel a heavy sensation of dizziness described as a "swimmy feeling".It is left sided and constant.Then over the past week, she developed a new posterior headache.At night, it would wake her up and she would note a pounding headache in the back  of her head, radiating to the left temple.It would be less intense during the day. MRI of brain and internal auditory canals with and without contrast and MRA of head from 06/27/15 were normal. She was referred to Dr. Rosita Fire of ENT and was diagnosed with Menieres. 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 She was prescribed Hygroton 25mg  daily with Micro-K 10 mEq daily. The dizziness resolved. 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 Since then, she would experience brief (seconds) dizziness with change in position. In early 2019, she began experiencing these brief dizzy spells more frequently. She began experiencing pressure in the suboccipital region again. In February 2019, she woke up with severe dizziness. It was not a spinning sensation but rather a sensation of an elevator quickly descending. It is fairly constant and aggravated by position but it is not only positional. Looking up or down aggravates it. She also reports pain in the upper thoracic region with numbness and tingling radiating across the upper back. Due to suboccipital headache and neck pain, she had an MRI of the cervical spine performed on 05/10/17, which was personally reviewed and was unremarkable.  She followed up with Dr. 07/10/17 of ENT, who believed her symptoms now may be migraine. She was started on topiramate. 100mg  at bedtime and 50mg  twice daily both caused drowsiness, so she remained on 50mg  at bedtime. She is not having the vertigo too much. She may have a mild disequilibrium. However, she has had an exacerbation of chronic fatigue. She started experiencing myalgias. She reports dysesthesias and numbness in her feet. It is different than the paresthesias she feels in her fingertips and face due to the topiramate. These are similar symptoms that she experienced 2 years ago. She continues to have the burning  pain in the upper thoracic region. It is very distressing.  Labs from May 2019 included a positive ANA with centromere pattern and 1:320 titer.  However other labs were unremarkable including sed rate 6, negative Lyme, CK 40, TSH 1.690, and B12 458.  NCV-EMG was ordered but patient never had it performed.  She had Covid in January 2021.  During that time, she developed left sided facial pain.  She experienced severe sharp pain in the left temple, anterior to the left ear, and down along the left side of her jaw.  She had no corresponding rash.  Sometimes eating may aggravate it but not talking or brushing her teeth.  Occasionally the pain may radiate to the left occipital region.  She also experienced aching in the left side of her jaw as well as tightness in the left side of her neck.  She also developed left sided facial numbness. Prior to Covid, she may have brief left sided jaw pain when eating, but it was infrequent.  Symptoms lasted a couple of weeks and resolved.  She had been doing fine until last Thursday when the symptoms returned.  After several days, she had to finally call out of work due to the pain.  Today is the first day that she feels relief.  When she touches her skin just anterior to her  ear, it feels numb.  No visual symptoms, facial droop, slurred speech or extremity symptoms.  Between these two events, she has been to the dentist.  She has crowns which were reportedly fine.  She has history of menstrual migraines. She reported an episode of numbness and tingling of the extremities, fatigue, weakness in hands and halitosis in 2007 following the birth of her first child.She had a workup for MS, including MRIs which were negative.She notes transient blurred vision.  Past Medical History: Past Medical History:  Diagnosis Date   Headache     Medications: Outpatient Encounter Medications as of 04/21/2020  Medication Sig Note   levonorgestrel (MIRENA) 20 MCG/24HR IUD 1 each by  Intrauterine route continuous.  05/29/2015: Pt placed IUD in March of 2016   magnesium 30 MG tablet Take 30 mg by mouth daily. (Patient not taking: Reported on 11/12/2019)    topiramate (TOPAMAX) 50 MG tablet TAKE 1 TABLET AT BEDTIME FOR ONE WEEK, THEN INCREASE TO 2 TABLETS AT BEDTIME (Patient not taking: Reported on 11/12/2019)    [DISCONTINUED] predniSONE (DELTASONE) 10 MG tablet Take 6tabs x1day, then 5tabs x1day, then 4tabs x1day, then 3tabs x1day, then 2tabs x1day, then 1tab x1day, then STOP (Patient not taking: Reported on 07/29/2017)    No facility-administered encounter medications on file as of 04/21/2020.    Allergies: No Known Allergies  Family History: Family History  Problem Relation Age of Onset   Thyroid disease Father    Hypertension Father    Stroke Father    Thyroid disease Mother    Osteoarthritis Maternal Grandmother    Cancer Maternal Grandfather        colon   Cancer Paternal Grandmother        unknown     Social History: Social History   Socioeconomic History   Marital status: Married    Spouse name: Not on file   Number of children: Not on file   Years of education: Not on file   Highest education level: Not on file  Occupational History   Not on file  Tobacco Use   Smoking status: Former Smoker   Smokeless tobacco: Never Used  Building services engineer Use: Never used  Substance and Sexual Activity   Alcohol use: Yes    Alcohol/week: 0.0 standard drinks    Comment: ocassionally weekend   Drug use: No   Sexual activity: Yes    Partners: Male    Birth control/protection: I.U.D.    Comment: Mirena inserted 08/2005  Other Topics Concern   Not on file  Social History Narrative   Right handed   One story home   Drinks caffeine   Social Determinants of Health   Financial Resource Strain: Not on file  Food Insecurity: Not on file  Transportation Needs: Not on file  Physical Activity: Not on file  Stress: Not on file  Social  Connections: Not on file  Intimate Partner Violence: Not on file    Observations/Objective:   Height 5\' 4"  (1.626 m), weight 150 lb (68 kg). No acute distress.  Alert and oriented.  Speech fluent and not dysarthric.  Language intact.    Assessment and Plan:   1.  Atypical left-sided trigeminal neuralgia  At this time, it has only been two days and mild.  She will continue to monitor.  If symptoms should progress and become more painful, she will contact me and we can start oxcarbazepine.  Discussed side effects such as dizziness and hyponatremia.  Follow  up 4 to 6 months.  Follow Up Instructions:    -I discussed the assessment and treatment plan with the patient. The patient was provided an opportunity to ask questions and all were answered. The patient agreed with the plan and demonstrated an understanding of the instructions.   The patient was advised to call back or seek an in-person evaluation if the symptoms worsen or if the condition fails to improve as anticipated.   Cira Servant, DO

## 2020-04-21 ENCOUNTER — Encounter: Payer: Self-pay | Admitting: Neurology

## 2020-04-21 ENCOUNTER — Other Ambulatory Visit: Payer: Self-pay

## 2020-04-21 ENCOUNTER — Telehealth (INDEPENDENT_AMBULATORY_CARE_PROVIDER_SITE_OTHER): Payer: BC Managed Care – PPO | Admitting: Neurology

## 2020-04-21 VITALS — Ht 64.0 in | Wt 150.0 lb

## 2020-04-21 DIAGNOSIS — G500 Trigeminal neuralgia: Secondary | ICD-10-CM | POA: Diagnosis not present

## 2020-05-11 DIAGNOSIS — Z113 Encounter for screening for infections with a predominantly sexual mode of transmission: Secondary | ICD-10-CM | POA: Diagnosis not present

## 2020-05-11 DIAGNOSIS — R350 Frequency of micturition: Secondary | ICD-10-CM | POA: Diagnosis not present

## 2020-05-11 DIAGNOSIS — Z01411 Encounter for gynecological examination (general) (routine) with abnormal findings: Secondary | ICD-10-CM | POA: Diagnosis not present

## 2020-05-11 DIAGNOSIS — N393 Stress incontinence (female) (male): Secondary | ICD-10-CM | POA: Diagnosis not present

## 2020-05-11 DIAGNOSIS — Z130 Encounter for screening for diseases of the blood and blood-forming organs and certain disorders involving the immune mechanism: Secondary | ICD-10-CM | POA: Diagnosis not present

## 2020-05-11 DIAGNOSIS — Z131 Encounter for screening for diabetes mellitus: Secondary | ICD-10-CM | POA: Diagnosis not present

## 2020-05-11 DIAGNOSIS — Z1329 Encounter for screening for other suspected endocrine disorder: Secondary | ICD-10-CM | POA: Diagnosis not present

## 2020-05-11 DIAGNOSIS — Z1231 Encounter for screening mammogram for malignant neoplasm of breast: Secondary | ICD-10-CM | POA: Diagnosis not present

## 2020-05-11 DIAGNOSIS — Z13220 Encounter for screening for lipoid disorders: Secondary | ICD-10-CM | POA: Diagnosis not present

## 2020-05-11 DIAGNOSIS — Z124 Encounter for screening for malignant neoplasm of cervix: Secondary | ICD-10-CM | POA: Diagnosis not present

## 2020-05-11 DIAGNOSIS — Z01419 Encounter for gynecological examination (general) (routine) without abnormal findings: Secondary | ICD-10-CM | POA: Diagnosis not present

## 2020-05-11 DIAGNOSIS — Z0000 Encounter for general adult medical examination without abnormal findings: Secondary | ICD-10-CM | POA: Diagnosis not present

## 2020-05-17 DIAGNOSIS — R3915 Urgency of urination: Secondary | ICD-10-CM | POA: Diagnosis not present

## 2020-05-17 DIAGNOSIS — R350 Frequency of micturition: Secondary | ICD-10-CM | POA: Diagnosis not present

## 2020-05-31 ENCOUNTER — Telehealth: Payer: Self-pay | Admitting: Neurology

## 2020-05-31 DIAGNOSIS — G500 Trigeminal neuralgia: Secondary | ICD-10-CM

## 2020-05-31 NOTE — Telephone Encounter (Signed)
Patient called and said she has trigeminal neuralgia and she's been wearing an N95 mask at work as she has a covid vaccine restriction.   Until recently she said she was fine with the mask but now it is not working well for her with her diagnosis and pain starting last week. It has been getting worse by the day and she called out of work today.   Now she is talking with an reasonable accomodation specialist ADA and wanted Dr. Everlena Cooper to know some forms will be coming his way.  Patient is trying out different masks as well without success.  Patient has questions about potentially starting a medication but she does need to speak with a nurse as the pain is now exacerbated by her mask usage.

## 2020-05-31 NOTE — Telephone Encounter (Signed)
Pt wants to go ahead and send in the medication until she finds out if her job will  accomendtate her because of the pain. This could take up to weeks to get approved. Pt will send in paperwork for Dr.Jaffe to sign.    1. Pt wanted to know if she could take the medication only when she has a flare up. 2. Pt wanted to know if she could get Gabapentin instead of Oxcabazpine?

## 2020-06-01 DIAGNOSIS — Z0279 Encounter for issue of other medical certificate: Secondary | ICD-10-CM

## 2020-06-01 NOTE — Telephone Encounter (Signed)
Send in prescription for gabapentin 100mg  - 1 capsule at bedtime for one week, then 2 capsules at bedtime for one week, then 3 capsules at bedtime

## 2020-06-01 NOTE — Telephone Encounter (Signed)
Patient dropped off forms for completion. Placed in Dr. Moises Blood box.

## 2020-06-01 NOTE — Telephone Encounter (Signed)
Once the pain has been controlled for a period of time, we can try discontinuing it.  It all depends If she would rather try gabapentin first, then please prescribe 100mg  capsule: 1 capsule three times daily for one week, then 2 capsules three times daily for one week, then 3 capsules three times daily.  If pain is controlled on 300mg  three times daily, then we can refill with a 300mg  capsule.

## 2020-06-01 NOTE — Telephone Encounter (Signed)
Patient advised of note.   Pt wants to go ahead with Taken Gabapentin once daily.

## 2020-06-01 NOTE — Telephone Encounter (Signed)
Oxcarbazepine should be taken twice a day.  Gabapentin can be taken anywhere from once a day (such as at bedtime) up to three times daily.  Regarding paperwork, have it sent to me and if I feel we need an office visit, then we can contact her

## 2020-06-01 NOTE — Telephone Encounter (Signed)
Pt wanted to know if the Oxcarbazepine could be taken less often then the Gabapentin. Pt do not want to take medication that much. She wants to take it only at night.  Please advise.    Per pt you may need to do visit to discuss her paperwork? What should she do?

## 2020-06-02 MED ORDER — GABAPENTIN 100 MG PO CAPS: ORAL_CAPSULE | ORAL | 1 refills | Status: AC

## 2020-06-02 NOTE — Telephone Encounter (Signed)
Gabapentin 100 mg 1 capsule at bedtime for one week, then 2 capsules at bedtime for one week, then 3 capsules at bedtime.   Sent to the pharmacy.

## 2020-06-07 ENCOUNTER — Telehealth: Payer: Self-pay

## 2020-06-07 NOTE — Telephone Encounter (Signed)
Pt advised FMLA Form filled out and faxed on 06/06/20

## 2020-08-16 DIAGNOSIS — J029 Acute pharyngitis, unspecified: Secondary | ICD-10-CM | POA: Diagnosis not present

## 2020-08-16 DIAGNOSIS — R059 Cough, unspecified: Secondary | ICD-10-CM | POA: Diagnosis not present

## 2020-08-16 DIAGNOSIS — H9209 Otalgia, unspecified ear: Secondary | ICD-10-CM | POA: Diagnosis not present

## 2020-08-16 DIAGNOSIS — R509 Fever, unspecified: Secondary | ICD-10-CM | POA: Diagnosis not present

## 2020-08-22 DIAGNOSIS — J029 Acute pharyngitis, unspecified: Secondary | ICD-10-CM | POA: Diagnosis not present

## 2020-08-22 DIAGNOSIS — B349 Viral infection, unspecified: Secondary | ICD-10-CM | POA: Diagnosis not present

## 2020-08-25 DIAGNOSIS — H938X3 Other specified disorders of ear, bilateral: Secondary | ICD-10-CM | POA: Diagnosis not present

## 2020-08-25 DIAGNOSIS — H6692 Otitis media, unspecified, left ear: Secondary | ICD-10-CM | POA: Diagnosis not present

## 2020-08-25 DIAGNOSIS — J0140 Acute pansinusitis, unspecified: Secondary | ICD-10-CM | POA: Diagnosis not present

## 2020-10-24 NOTE — Progress Notes (Signed)
NEUROLOGY FOLLOW UP OFFICE NOTE  [name] [id number]  Assessment/Plan:   Atypical left-sided trigeminal neuralgia  Restart gabapentin today (may take 300mg  at bedtime or 100mg  TID) to hopefully provide coverage by Tuesday She may take a baclofen 5mg  right before the procedure if needed  Follow up 6 months.  Subjective:  Tanya Bush Bush is a 45 year old right-handed female last seen in 2019 presents today for new issue, left sided facial pain and numbness.   UPDATE: Current medication:  none Past medication:  gabapentin 300mg  QHS (effective)  Started on gabapentin at end of March.  Helpful.  She currently doesn't need it.  However, she found out from the dentist that she needs dental work to treat a cavity under her crown.  She is concerned that it will trigger the trigeminal neuralgia.  She is scheduled to have the procedure done on Tuesday.   HISTORY:  On the evening of 10/08/14, she developed severe throbbing pain on both sides of the neck, left worse than right, and radiating up to the bilateral posterior and parietal head and down to the upper back.  Moving neck in all directions exacerbates it, but mostly neck turn to left and side-bend to right.  Laying supine or on side in bed caused increased pain.  On 10/10/14, she saw her PCP.  She was prescribed Flexeril 10mg  and Naproxen 375mg , which have not been too effective.  She presented to the ED on 10/13/14 for worsening pain, as well as numbness and tingling on the left from the jaw line down to the rhomboids.  There was no associated weakness or numbness involving the arm or hand.  CT of head and cervical spine were unremarkable.  She received a Toradol injection and was discharged with Vicodin.  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  She denied any fall, neck injury or strenuous activity preceding onset of the pain.   She was treated by Dr. 12/08/14 of Sports Medicine and headaches resolved.   In December 2016-January 2017 she developed a cold, consisting of nasal congestion.  That February, she developed fatigue and myalgias (mostly involving thighs).  She denied fever.  She went to her PCP where she was diagnosed with the flu and was treated with Tamiflu.  The symptoms would wax and wane, with alternating weeks of feeling okay.  She also noted left sided sore throat, ringing in the ears, weight loss.  She was found to have "fluid" in her left ear.  Lab work from 05/15/15 showed WBC of 10.5 with neutrophils of 77%.  CMP was normal.  CRP was 3.9, ANA negative, but Sed rate was mildly elevated at 44.  Thyroid panel was negative.  Vitamin D was 31.6 and Lyme was negative.  Repeat CBC on 06/02/15 was unremarkable with WBC of 8.  Sed rate was normal at 12.  In the last week of March, she was riding in a car.  She reached over and developed sudden onset of vertigo, described as spinning and was positional.  She received the Epley maneuver, which helped, but she continues to feel a heavy sensation of dizziness described as a "swimmy feeling".  It is left sided and constant.  Then over the past week, she developed a new posterior headache.  At night, it would wake her up and she would note a pounding headache in the back of  her head, radiating to the left temple.  It would be less intense during the day.  MRI of brain and internal auditory canals with and without contrast and MRA of head from 06/27/15 were normal.  She was referred to Dr. Rosita Fire of ENT and was diagnosed with Meniere's.  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  She was prescribed Hygroton 25mg  daily with Micro-K 10 mEq daily.  The dizziness resolved.  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  Since then, she would experience brief (seconds) dizziness with change in  position.  In early 2019, she began experiencing these brief dizzy spells more frequently.  She began experiencing pressure in the suboccipital region again.  In February 2019, she woke up with severe dizziness.  It was not a spinning sensation but rather a sensation of an elevator quickly descending.  It is fairly constant and aggravated by position but it is not only positional.  Looking up or down aggravates it.  She also reports pain in the upper thoracic region with numbness and tingling radiating across the upper back.   Due to suboccipital headache and neck pain, she had an MRI of the cervical spine performed on 05/10/17, which was personally reviewed and was unremarkable.   She followed up with Dr. 07/10/17 of ENT, who believed her symptoms now may be migraine.  She was started on topiramate.  100mg  at bedtime and 50mg  twice daily both caused drowsiness, so she remained on 50mg  at bedtime.  She is not having the vertigo too much.  She may have a mild disequilibrium.  However, she has had an exacerbation of chronic fatigue.  She started experiencing myalgias.  She reports dysesthesias and numbness in her feet.  It is different than the paresthesias she feels in her fingertips and face due to the topiramate.  These are similar symptoms that she experienced 2 years ago.  She continues to have the burning pain in the upper thoracic region.  It is very distressing.  Labs from May 2019 included a positive ANA with centromere pattern and 1:320 titer.  However other labs were unremarkable including sed rate 6, negative Lyme, CK 40, TSH 1.690, and B12 458.  NCV-EMG was ordered but patient never had it performed.   She had Covid in January 2021.  During that time, she developed left sided facial pain.  She experienced severe sharp pain in the left temple, anterior to the left ear, and down along the left side of her jaw.  She had no corresponding rash.  Sometimes eating may aggravate it but not talking or brushing her  teeth.  Occasionally the pain may radiate to the left occipital region.  She also experienced aching in the left side of her jaw as well as tightness in the left side of her neck.  She also developed left sided facial numbness. Prior to Covid, she may have brief left sided jaw pain when eating, but it was infrequent.  Symptoms come and go.  She has been to the dentist.  She has crowns which were reportedly fine.  MRI of brain and trigeminal nerves with and without contrast on 12/06/2019 was normal.     She has history of menstrual migraines.  She reported an episode of numbness and tingling of the extremities, fatigue, weakness in hands and halitosis in 2007 following the birth of her first child.  She had a workup  for MS, including MRIs which were negative.  She notes transient blurred vision.  PAST MEDICAL HISTORY: Past Medical History:  Diagnosis Date   Headache     MEDICATIONS: Current Outpatient Medications on File Prior to Visit  Medication Sig Dispense Refill   gabapentin (NEURONTIN) 100 MG capsule Take 1 capsule at bedtime for one week, then 2 capsules at bedtime for one week, then 3 capsules at bedtime 90 capsule 1   levonorgestrel (MIRENA) 20 MCG/24HR IUD 1 each by Intrauterine route continuous.      magnesium 30 MG tablet Take 30 mg by mouth daily. (Patient not taking: No sig reported)     topiramate (TOPAMAX) 50 MG tablet TAKE 1 TABLET AT BEDTIME FOR ONE WEEK, THEN INCREASE TO 2 TABLETS AT BEDTIME (Patient not taking: No sig reported) 60 tablet 5   No current facility-administered medications on file prior to visit.    ALLERGIES: No Known Allergies  FAMILY HISTORY: Family History  Problem Relation Age of Onset   Thyroid disease Father    Hypertension Father    Stroke Father    Thyroid disease Mother    Osteoarthritis Maternal Grandmother    Cancer Maternal Grandfather        colon   Cancer Paternal Grandmother        unknown       Objective:  Blood pressure 112/67,  pulse 97, height 5\' 5"  (1.651 m), weight 157 lb (71.2 kg), SpO2 100 %. General: No acute distress.  Patient appears well-groomed.   Head:  Normocephalic/atraumatic   , DO  CC: Shon Millet, MD

## 2020-10-26 ENCOUNTER — Other Ambulatory Visit: Payer: Self-pay

## 2020-10-26 ENCOUNTER — Encounter: Payer: Self-pay | Admitting: Neurology

## 2020-10-26 ENCOUNTER — Ambulatory Visit (INDEPENDENT_AMBULATORY_CARE_PROVIDER_SITE_OTHER): Payer: BC Managed Care – PPO | Admitting: Neurology

## 2020-10-26 VITALS — BP 112/67 | HR 97 | Ht 65.0 in | Wt 157.0 lb

## 2020-10-26 DIAGNOSIS — G500 Trigeminal neuralgia: Secondary | ICD-10-CM | POA: Diagnosis not present

## 2020-10-26 MED ORDER — BACLOFEN 5 MG PO TABS: 5.0000 mg | ORAL_TABLET | Freq: Three times a day (TID) | ORAL | 0 refills | Status: DC | PRN

## 2020-10-26 NOTE — Patient Instructions (Signed)
Start taking gabapentin today.  May take either 1 to 3 tablets at bedtime or may take 1 tablet three times daily. Take baclofen before procedure. Will fill forms and send by the 31 Follow up 6 months.

## 2020-11-03 ENCOUNTER — Telehealth: Payer: Self-pay | Admitting: Neurology

## 2020-11-03 NOTE — Telephone Encounter (Signed)
Pt advised paperwork faxed over today at 12:05 pm.

## 2020-11-03 NOTE — Telephone Encounter (Signed)
Pt called and LM. Tanya Bush was to send out paper work for her work accommodations by 11/01/20 or they were going to close her file. She stated jaffe assured her it would be done and she did not have to pay the 25.00. they did not get the paper work done and they have closed her file. She said this needs to be sent asap. It needs to be faxed to Hedy Jacob at [phone]. She stated all this information she should be on the paper work that we have.

## 2020-11-19 ENCOUNTER — Other Ambulatory Visit: Payer: Self-pay | Admitting: Neurology

## 2021-04-19 DIAGNOSIS — N644 Mastodynia: Secondary | ICD-10-CM | POA: Diagnosis not present

## 2021-05-09 NOTE — Progress Notes (Signed)
Virtual Visit via Video Note The purpose of this virtual visit is to provide medical care while limiting exposure to the novel coronavirus.    Consent was obtained for video visit:  Yes.   Answered questions that patient had about telehealth interaction:  Yes.   I discussed the limitations, risks, security and privacy concerns of performing an evaluation and management service by telemedicine. I also discussed with the patient that there may be a patient responsible charge related to this service. The patient expressed understanding and agreed to proceed.  Pt location: Home Physician Location: office Name of referring provider:  Juluis RainierBarnes, Elizabeth, MD I connected with Tanya LawlessKelly J Pho at patients initiation/request on 05/11/2021 at 10:10 AM EST by video enabled telemedicine application and verified that I am speaking with the correct person using two identifiers. Pt MRN:  [id number] Pt DOB:  Aug 31, 1975 Video Participants:  Tanya Bush  Assessment and Plan:   Atypical left-sided trigeminal neuralgia   May restart gabapentin for flare up.  She also has baclofen on-hand if needed Follow up 1 year   Subjective:  Tanya EveryKelly Bush is a 46 year old right-handed female last seen in 2019 presents today for new issue, left sided facial pain and numbness.   UPDATE: Current medication:  gabapentin (on hand, not needed), baclofen (on-hand, not needed)  Doing well.  Had a mild flare up for a couple of days about 3 weeks ago.   HISTORY:  On the evening of 10/08/14, she developed severe throbbing pain on both sides of the neck, left worse than right, and radiating up to the bilateral posterior and parietal head and down to the upper back.  Moving neck in all directions exacerbates it, but mostly neck turn to left and side-bend to right.  Laying supine or on side in bed caused increased pain.  On 10/10/14, she saw her PCP.  She was prescribed Flexeril 10mg  and Naproxen 375mg , which have not been too  effective.  She presented to the ED on 10/13/14 for worsening pain, as well as numbness and tingling on the left from the jaw line down to the rhomboids.  There was no associated weakness or numbness involving the arm or hand.  CT of head and cervical spine were unremarkable.  She received a Toradol injection and was discharged with Vicodin.  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  She denied any fall, neck injury or strenuous activity preceding onset of the pain.  She was treated by Dr. Antoine PrimasZachary Smith of [facility] Medicine and headaches resolved.   In December 2016-January 2017 she developed a cold, consisting of nasal congestion.  That February, she developed fatigue and myalgias (mostly involving thighs).  She denied fever.  She went to her PCP where she was diagnosed with the flu and was treated with Tamiflu.  The symptoms would wax and wane, with alternating weeks of feeling okay.  She also noted left sided sore throat, ringing in the ears, weight loss.  She was found to have "fluid" in her left ear.  Lab work from 05/15/15 showed WBC of 10.5 with neutrophils of 77%.  CMP was normal.  CRP was 3.9, ANA negative, but Sed rate was mildly elevated at 44.  Thyroid panel was negative.  Vitamin D was 31.6 and Lyme was negative.  Repeat CBC on 06/02/15 was unremarkable with WBC of 8.  Sed rate was  normal at 12.  In the last week of March, she was riding in a car.  She reached over and developed sudden onset of vertigo, described as spinning and was positional.  She received the Epley maneuver, which helped, but she continues to feel a heavy sensation of dizziness described as a "swimmy feeling".  It is left sided and constant.  Then over the past week, she developed a new posterior headache.  At night, it would wake her up and she would note a pounding headache in the back of her head, radiating to the  left temple.  It would be less intense during the day.  MRI of brain and internal auditory canals with and without contrast and MRA of head from 06/27/15 were normal.  She was referred to Dr. Rosita Fire of ENT and was diagnosed with Menieres.  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  She was prescribed Hygroton 25mg  daily with Micro-K 10 mEq daily.  The dizziness resolved.  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  Since then, she would experience brief (seconds) dizziness with change in position.  In early 2019, she began experiencing these brief dizzy spells more frequently.  She began experiencing pressure in the suboccipital region again.  In February 2019, she woke up with severe dizziness.  It was not a spinning sensation but rather a sensation of an elevator quickly descending.  It is fairly constant and aggravated by position but it is not only positional.  Looking up or down aggravates it.  She also reports pain in the upper thoracic region with numbness and tingling radiating across the upper back.   Due to suboccipital headache and neck pain, she had an MRI of the cervical spine performed on 05/10/17, which was personally reviewed and was unremarkable.   She followed up with Dr. 07/10/17 of ENT, who believed her symptoms now may be migraine.  She was started on topiramate.  100mg  at bedtime and 50mg  twice daily both caused drowsiness, so she remained on 50mg  at bedtime.  She is not having the vertigo too much.  She may have a mild disequilibrium.  However, she has had an exacerbation of chronic fatigue.  She started experiencing myalgias.  She reports dysesthesias and numbness in her feet.  It is different than the paresthesias she feels in her fingertips and face due to the topiramate.  These are similar symptoms that she experienced 2 years ago.  She continues to have the burning pain in the upper thoracic  region.  It is very distressing.  Labs from May 2019 included a positive ANA with centromere pattern and 1:320 titer.  However other labs were unremarkable including sed rate 6, negative Lyme, CK 40, TSH 1.690, and B12 458.  NCV-EMG was ordered but patient never had it performed.   She had Covid in January 2021.  During that time, she developed left sided facial pain.  She experienced severe sharp pain in the left temple, anterior to the left ear, and down along the left side of her jaw.  She had no corresponding rash.  Sometimes eating may aggravate it but not talking or brushing her teeth.  Occasionally the pain may radiate to the left occipital region.  She also experienced aching in the left side of her jaw as well as tightness in the left side of her neck.  She also developed left sided facial numbness. Prior  to Covid, she may have brief left sided jaw pain when eating, but it was infrequent.  Symptoms come and go.  She has been to the dentist.  She has crowns which were reportedly fine.  MRI of brain and trigeminal nerves with and without contrast on 12/06/2019 was normal.     She has history of menstrual migraines.  She reported an episode of numbness and tingling of the extremities, fatigue, weakness in hands and halitosis in 2007 following the birth of her first child.  She had a workup for MS, including MRIs which were negative.  She notes transient blurred vision.  Past Medical History: Past Medical History:  Diagnosis Date   Headache     Medications: Outpatient Encounter Medications as of 05/11/2021  Medication Sig Note   Baclofen 5 MG TABS TAKE 1 TABLET BY MOUTH 3 (THREE) TIMES DAILY AS NEEDED.    gabapentin (NEURONTIN) 100 MG capsule Take 1 capsule at bedtime for one week, then 2 capsules at bedtime for one week, then 3 capsules at bedtime    levonorgestrel (MIRENA) 20 MCG/24HR IUD 1 each by Intrauterine route continuous.  05/29/2015: Pt placed IUD in March of 2016   magnesium 30 MG  tablet Take 30 mg by mouth daily.    topiramate (TOPAMAX) 50 MG tablet TAKE 1 TABLET AT BEDTIME FOR ONE WEEK, THEN INCREASE TO 2 TABLETS AT BEDTIME (Patient not taking: No sig reported)    No facility-administered encounter medications on file as of 05/11/2021.    Allergies: No Known Allergies  Family History: Family History  Problem Relation Age of Onset   Thyroid disease Father    Hypertension Father    Stroke Father    Thyroid disease Mother    Osteoarthritis Maternal Grandmother    Cancer Maternal Grandfather        colon   Cancer Paternal Grandmother        unknown     Observations/Objective:   No acute distress.  Alert and oriented.  Speech fluent and not dysarthric.  Language intact.  Eyes orthophoric on primary gaze.  Face symmetric.   Follow Up Instructions:    -I discussed the assessment and treatment plan with the patient. The patient was provided an opportunity to ask questions and all were answered. The patient agreed with the plan and demonstrated an understanding of the instructions.   The patient was advised to call back or seek an in-person evaluation if the symptoms worsen or if the condition fails to improve as anticipated.   Cira Servant, DO

## 2021-05-11 ENCOUNTER — Telehealth (INDEPENDENT_AMBULATORY_CARE_PROVIDER_SITE_OTHER): Payer: BC Managed Care – PPO | Admitting: Neurology

## 2021-05-11 ENCOUNTER — Encounter: Payer: Self-pay | Admitting: Neurology

## 2021-05-11 ENCOUNTER — Other Ambulatory Visit: Payer: Self-pay

## 2021-05-11 VITALS — Ht 65.0 in | Wt 160.0 lb

## 2021-05-11 DIAGNOSIS — G500 Trigeminal neuralgia: Secondary | ICD-10-CM

## 2021-06-01 DIAGNOSIS — Z1231 Encounter for screening mammogram for malignant neoplasm of breast: Secondary | ICD-10-CM | POA: Diagnosis not present

## 2021-06-01 DIAGNOSIS — Z6827 Body mass index (BMI) 27.0-27.9, adult: Secondary | ICD-10-CM | POA: Diagnosis not present

## 2021-06-01 DIAGNOSIS — R3914 Feeling of incomplete bladder emptying: Secondary | ICD-10-CM | POA: Diagnosis not present

## 2021-06-01 DIAGNOSIS — Z01419 Encounter for gynecological examination (general) (routine) without abnormal findings: Secondary | ICD-10-CM | POA: Diagnosis not present

## 2021-06-01 DIAGNOSIS — N898 Other specified noninflammatory disorders of vagina: Secondary | ICD-10-CM | POA: Diagnosis not present

## 2021-06-15 DIAGNOSIS — D2272 Melanocytic nevi of left lower limb, including hip: Secondary | ICD-10-CM | POA: Diagnosis not present

## 2021-06-15 DIAGNOSIS — L578 Other skin changes due to chronic exposure to nonionizing radiation: Secondary | ICD-10-CM | POA: Diagnosis not present

## 2021-06-15 DIAGNOSIS — D2262 Melanocytic nevi of left upper limb, including shoulder: Secondary | ICD-10-CM | POA: Diagnosis not present

## 2021-06-15 DIAGNOSIS — L814 Other melanin hyperpigmentation: Secondary | ICD-10-CM | POA: Diagnosis not present

## 2021-06-15 DIAGNOSIS — L818 Other specified disorders of pigmentation: Secondary | ICD-10-CM | POA: Diagnosis not present

## 2021-06-15 DIAGNOSIS — D485 Neoplasm of uncertain behavior of skin: Secondary | ICD-10-CM | POA: Diagnosis not present

## 2021-06-22 DIAGNOSIS — E559 Vitamin D deficiency, unspecified: Secondary | ICD-10-CM | POA: Diagnosis not present

## 2021-06-22 DIAGNOSIS — Z1231 Encounter for screening mammogram for malignant neoplasm of breast: Secondary | ICD-10-CM | POA: Diagnosis not present

## 2021-06-22 DIAGNOSIS — Z0000 Encounter for general adult medical examination without abnormal findings: Secondary | ICD-10-CM | POA: Diagnosis not present

## 2021-06-22 DIAGNOSIS — Z13220 Encounter for screening for lipoid disorders: Secondary | ICD-10-CM | POA: Diagnosis not present

## 2022-04-26 ENCOUNTER — Ambulatory Visit: Payer: BC Managed Care – PPO | Admitting: Neurology

## 2022-05-13 ENCOUNTER — Ambulatory Visit: Payer: BC Managed Care – PPO | Admitting: Neurology

## 2022-07-05 NOTE — Progress Notes (Unsigned)
NEUROLOGY FOLLOW UP OFFICE NOTE  Tanya Bush [id number]  Assessment/Plan:   Migraine with aura, with status migrainosus, intractable   Prednisone taper to try and break current intractable episode Start venlafaxine XR 37.5mg  daily.  We can increase to 75mg  daily in 4 weeks if needed Consider Migrelief Check labs B2, B12, TSH, Mg, D, CBC Follow up 6 months.   Subjective:  Tanya Bush is a 47 year old right-handed female last seen in 2019 presents today for new issue, left sided facial pain and numbness.   UPDATE: Current medication:  gabapentin (on hand, not needed),    She began having a recurrence of the atypical migraines on 4/20.  Occipital pressure radiating into the neck as well as temporal and bilateral retro-orbital pressure with left sided facial numbness, extreme fatigue, blurred/wavy vision, osmophobia.  Most prominent is visual motion sensitivity.  Symptoms have been persistent for 3 wakes thus far.  She had to miss two days of work.  She hasn't had a severe episode since 2019.  Since then, she has had mild episodes lasting up to a week from time to time.    HISTORY:  On the evening of 10/08/14, she developed severe throbbing pain on both sides of the neck, left worse than right, and radiating up to the bilateral posterior and parietal head and down to the upper back.  Moving neck in all directions exacerbates it, but mostly neck turn to left and side-bend to right.  Laying supine or on side in bed caused increased pain.  On 10/10/14, she saw her PCP.  She was prescribed Flexeril 10mg  and Naproxen 375mg , which have not been too effective.  She presented to the ED on 10/13/14 for worsening pain, as well as numbness and tingling on the left from the jaw line down to the rhomboids.  There was no associated weakness or numbness involving the arm or hand.  CT of head and cervical spine were unremarkable.  She received a Toradol injection and was discharged with Vicodin.  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  She denied any fall, neck injury or strenuous activity preceding onset of the pain.  She was treated by Dr. Antoine Primas of Sports Medicine and headaches resolved.   In December 2016-January 2017 she developed a cold, consisting of nasal congestion.  That February, she developed fatigue and myalgias (mostly involving thighs).  She denied fever.  She went to her PCP where she was diagnosed with the flu and was treated with Tamiflu.  The symptoms would wax and wane, with alternating weeks of feeling okay.  She also noted left sided sore throat, ringing in the ears, weight loss.  She was found to have "fluid" in her left ear.  Lab work from 05/15/15 showed WBC of 10.5 with neutrophils of 77%.  CMP was normal.  CRP was 3.9, ANA negative, but Sed rate was mildly elevated at 44.  Thyroid panel was negative.  Vitamin D was 31.6 and Lyme was negative.  Repeat CBC on 06/02/15 was unremarkable with WBC of 8.  Sed rate was normal at 12.  In the last week of March, she was riding in a car.  She reached over and developed sudden onset of vertigo, described as spinning and was positional.  She received the Epley maneuver, which helped, but she continues to feel a heavy sensation of dizziness described  as a "swimmy feeling".  It is left sided and constant.  Then over the past week, she developed a new posterior headache.  At night, it would wake her up and she would note a pounding headache in the back of her head, radiating to the left temple.  It would be less intense during the day.  MRI of brain and internal auditory canals with and without contrast and MRA of head from 06/27/15 were normal.  She was referred to Dr. Rosita Fire of ENT and was diagnosed with Meniere's.  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  She was prescribed Hygroton 25mg   daily with Micro-K 10 mEq daily.  The dizziness resolved.  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  Since then, she would experience brief (seconds) dizziness with change in position.  In early 2019, she began experiencing these brief dizzy spells more frequently.  She began experiencing pressure in the suboccipital region again.  In February 2019, she woke up with severe dizziness.  It was not a spinning sensation but rather a sensation of an elevator quickly descending.  It is fairly constant and aggravated by position but it is not only positional.  Looking up or down aggravates it.  She also reports pain in the upper thoracic region with numbness and tingling radiating across the upper back.   Due to suboccipital headache and neck pain, she had an MRI of the cervical spine performed on 05/10/17, which was personally reviewed and was unremarkable.   She followed up with Dr. Dorma Russell of ENT, who believed her symptoms now may be migraine.  She was started on topiramate.  100mg  at bedtime and 50mg  twice daily both caused drowsiness, so she remained on 50mg  at bedtime.  She is not having the vertigo too much.  She may have a mild disequilibrium.  However, she has had an exacerbation of chronic fatigue.  She started experiencing myalgias.  She reports dysesthesias and numbness in her feet.  It is different than the paresthesias she feels in her fingertips and face due to the topiramate.  These are similar symptoms that she experienced 2 years ago.  She continues to have the burning pain in the upper thoracic region.  It is very distressing.  Labs from May 2019 included a positive ANA with centromere pattern and 1:320 titer.  However other labs were unremarkable including sed rate 6, negative Lyme, CK 40, TSH 1.690, and B12 458.  NCV-EMG was ordered but patient never had it performed.   She had Covid in January 2021.  During that time, she developed left sided  facial pain.  She experienced severe sharp pain in the left temple, anterior to the left ear, and down along the left side of her jaw.  She had no corresponding rash.  Sometimes eating may aggravate it but not talking or brushing her teeth.  Occasionally the pain may radiate to the left occipital region.  She also experienced aching in the left side of her jaw as well as tightness in the left side of her neck.  She also developed left sided facial numbness. Prior to Covid, she may have brief left sided jaw pain when eating, but it was infrequent.  Symptoms come and go.  She has been to the dentist.  She has crowns which were reportedly fine.  MRI of brain and trigeminal nerves with and without contrast on 12/06/2019 was normal.  Previously took baclofen   She has history of menstrual migraines.  She reported an episode of numbness and tingling of the extremities, fatigue, weakness in hands and halitosis in 2007 following the birth of her first child.  She had a workup for MS, including MRIs which were negative.  She notes transient blurred vision.  Past medications:  topiramate, baclofen  PAST MEDICAL HISTORY: Past Medical History:  Diagnosis Date   Headache     MEDICATIONS: Current Outpatient Medications on File Prior to Visit  Medication Sig Dispense Refill   Baclofen 5 MG TABS TAKE 1 TABLET BY MOUTH 3 (THREE) TIMES DAILY AS NEEDED. 90 tablet 5   gabapentin (NEURONTIN) 100 MG capsule Take 1 capsule at bedtime for one week, then 2 capsules at bedtime for one week, then 3 capsules at bedtime 90 capsule 1   levonorgestrel (MIRENA) 20 MCG/24HR IUD 1 each by Intrauterine route continuous.      magnesium 30 MG tablet Take 30 mg by mouth daily.     topiramate (TOPAMAX) 50 MG tablet TAKE 1 TABLET AT BEDTIME FOR ONE WEEK, THEN INCREASE TO 2 TABLETS AT BEDTIME (Patient not taking: No sig reported) 60 tablet 5   No current facility-administered medications on file prior to visit.    ALLERGIES: No  Known Allergies  FAMILY HISTORY: Family History  Problem Relation Age of Onset   Thyroid disease Father    Hypertension Father    Stroke Father    Thyroid disease Mother    Osteoarthritis Maternal Grandmother    Cancer Maternal Grandfather        colon   Cancer Paternal Grandmother        unknown       Objective:  Blood pressure (!) 147/73, pulse 83, height 5\' 7"  (1.702 m), weight 168 lb (76.2 kg), SpO2 95 %. General: No acute distress.  Patient appears well-groomed.   Head:  Normocephalic/atraumatic Eyes:  Fundi examined but not visualized Neck: supple, no paraspinal tenderness, full range of motion Heart:  Regular rate and rhythm Lungs:  Clear to auscultation bilaterally Back: No paraspinal tenderness Neurological Exam: alert and oriented to person, place, and time.  Speech fluent and not dysarthric, language intact.  CN II-XII intact. Bulk and tone normal, muscle strength 5/5 throughout.  Sensation to light touch intact.  Deep tendon reflexes 2+ throughout.  Finger to nose testing intact.  Gait normal, Romberg negative.   Shon Millet, DO  CC: Juluis Rainier, MD

## 2022-07-08 ENCOUNTER — Other Ambulatory Visit (INDEPENDENT_AMBULATORY_CARE_PROVIDER_SITE_OTHER): Payer: BC Managed Care – PPO

## 2022-07-08 ENCOUNTER — Encounter: Payer: Self-pay | Admitting: Neurology

## 2022-07-08 ENCOUNTER — Ambulatory Visit (INDEPENDENT_AMBULATORY_CARE_PROVIDER_SITE_OTHER): Payer: BC Managed Care – PPO | Admitting: Neurology

## 2022-07-08 VITALS — BP 100/60 | HR 83 | Ht 67.0 in | Wt 168.0 lb

## 2022-07-08 DIAGNOSIS — G43011 Migraine without aura, intractable, with status migrainosus: Secondary | ICD-10-CM

## 2022-07-08 DIAGNOSIS — G43111 Migraine with aura, intractable, with status migrainosus: Secondary | ICD-10-CM

## 2022-07-08 MED ORDER — PREDNISONE 10 MG PO TABS: ORAL_TABLET | ORAL | 0 refills | Status: DC

## 2022-07-08 MED ORDER — VENLAFAXINE HCL ER 37.5 MG PO CP24: 37.5000 mg | ORAL_CAPSULE | Freq: Every day | ORAL | 5 refills | Status: DC

## 2022-07-08 NOTE — Patient Instructions (Signed)
Start prednisone taper Start venlafaxine XR 37.5mg  daily.  If no improvement in 4 weeks, contact me Consider taking Migrelief vitamin/supplement pill Check CBC with diff/PLT, B2, B12, vit D, TSH, Mg Follow up 6 months.

## 2022-07-09 ENCOUNTER — Telehealth: Payer: Self-pay

## 2022-07-09 LAB — MAGNESIUM: Magnesium: 2.2 mg/dL (ref 1.5–2.5)

## 2022-07-09 LAB — VITAMIN D 25 HYDROXY (VIT D DEFICIENCY, FRACTURES): Vit D, 25-Hydroxy: 34 ng/mL (ref 30–100)

## 2022-07-09 LAB — TSH: TSH: 1.44 mIU/L

## 2022-07-10 LAB — SPECIMEN STATUS REPORT

## 2022-07-10 NOTE — Telephone Encounter (Signed)
Called pt and left message of results per Dr. Everlena Cooper on her voice mail. Also reported that [facility] Mayo has not seen the papers at this time

## 2022-07-12 DIAGNOSIS — Z0279 Encounter for issue of other medical certificate: Secondary | ICD-10-CM

## 2022-07-15 LAB — CBC WITH DIFFERENTIAL/PLATELET
Basophils Absolute: 0.1 10*3/uL (ref 0.0–0.2)
Basos: 1 %
EOS (ABSOLUTE): 0.1 10*3/uL (ref 0.0–0.4)
Eos: 1 %
Hematocrit: 42.4 % (ref 34.0–46.6)
Hemoglobin: 14.3 g/dL (ref 11.1–15.9)
Immature Grans (Abs): 0 10*3/uL (ref 0.0–0.1)
Immature Granulocytes: 0 %
Lymphocytes Absolute: 2.1 10*3/uL (ref 0.7–3.1)
Lymphs: 19 %
MCH: 30.2 pg (ref 26.6–33.0)
MCHC: 33.7 g/dL (ref 31.5–35.7)
MCV: 90 fL (ref 79–97)
Monocytes Absolute: 0.6 10*3/uL (ref 0.1–0.9)
Monocytes: 5 %
Neutrophils Absolute: 8.1 10*3/uL — ABNORMAL HIGH (ref 1.4–7.0)
Neutrophils: 74 %
Platelets: 367 10*3/uL (ref 150–450)
RBC: 4.74 x10E6/uL (ref 3.77–5.28)
RDW: 12.5 % (ref 11.7–15.4)
WBC: 11 10*3/uL — ABNORMAL HIGH (ref 3.4–10.8)

## 2022-07-15 LAB — VITAMIN B12: Vitamin B-12: 511 pg/mL (ref 232–1245)

## 2022-07-15 LAB — VITAMIN B2, WHOLE BLOOD

## 2022-07-16 NOTE — Progress Notes (Unsigned)
FMLA Paperwork Received

## 2022-07-19 ENCOUNTER — Telehealth: Payer: Self-pay | Admitting: Neurology

## 2022-07-19 NOTE — Telephone Encounter (Signed)
Pt states that she needs the FMLA paperwork that Dr Youlanda Mighty out sent to Hedy Jacob at [phone] she is with [employer]

## 2022-07-23 NOTE — Telephone Encounter (Signed)
FMLA Filled out will fax to [employer] at [facility].

## 2022-07-30 ENCOUNTER — Other Ambulatory Visit: Payer: Self-pay | Admitting: Neurology

## 2022-08-02 ENCOUNTER — Telehealth: Payer: Self-pay

## 2022-08-02 NOTE — Telephone Encounter (Signed)
Message left by patient, Tanya Bush Tanya Bush declined the frequency on Tanya Bush FMLA paperwork.  Please addend it to say 4 days instead of 14 days.

## 2022-12-16 NOTE — Progress Notes (Unsigned)
NEUROLOGY FOLLOW UP OFFICE NOTE  MILESSA HOGAN [id number]  Assessment/Plan:   Migraine with aura, with status migrainosus, intractable    If she should start to have a recurrence of her migraine, she will contact me and I will prescribe another prednisone taper. She is going to start the venlafaxine XR 37.5mg  daily to at least address her anxiety. Gabapentin as needed for trigeminal neuralgia flare Follow up 6 months.  Total time spent in chart and face to face with patient:  25 minutes.   Subjective:  Aracelia Brinson is a 47 year old right-handed female last seen in 2019 presents today for new issue, left sided facial pain and numbness.   UPDATE:  Labs from May include Mg 2.2, vit D 34, B12 511, TSH 1.44 and and CBC with stable borderline elevated WBC 11 and absolute neutrophils 8.1 x10E3/uL.    She took the prednisone taper and filled the venlafaxine once, but as symptoms resolved, she never refilled it.  However she is concerned about having a recurrence of her migraines as she is currently under stress due to discovering mold in her home.       HISTORY:  Migraine On the evening of 10/08/14, she developed severe throbbing pain on both sides of the neck, left worse than right, and radiating up to the bilateral posterior and parietal head and down to the upper back.  Moving neck in all directions exacerbates it, but mostly neck turn to left and side-bend to right.  Laying supine or on side in bed caused increased pain.  On 10/10/14, she saw her PCP.  She was prescribed Flexeril 10mg  and Naproxen 375mg , which have not been too effective.  She presented to the ED on 10/13/14 for worsening pain, as well as numbness and tingling on the left from the jaw line down to the rhomboids.  There was no associated weakness or numbness involving the arm or hand.  CT of head and cervical spine were unremarkable.  She received a Toradol injection and was discharged with Vicodin.  She had an [place] of head and  neck to follow up incidental thyroid lesion, which showed multinodular heterogeneous thyroid, which did not meet criteria for biopsy.  She denied any fall, neck injury or strenuous activity preceding onset of the pain.  She was treated by Dr. Antoine Primas of Sports Medicine and headaches resolved.   In December 2016-January 2017 she developed a cold, consisting of nasal congestion.  That February, she developed fatigue and myalgias (mostly involving thighs).  She denied fever.  She went to her PCP where she was diagnosed with the flu and was treated with Tamiflu.  The symptoms would wax and wane, with alternating weeks of feeling okay.  She also noted left sided sore throat, ringing in the ears, weight loss.  She was found to have "fluid" in her left ear.  Lab work from 05/15/15 showed WBC of 10.5 with neutrophils of 77%.  CMP was normal.  CRP was 3.9, ANA negative, but Sed rate was mildly elevated at 44.  Thyroid panel was negative.  Vitamin D was 31.6 and Lyme was negative.  Repeat CBC on 06/02/15 was unremarkable with WBC of 8.  Sed rate was normal at 12.  In the last week of March, she was riding in a car.  She reached over and developed sudden onset of vertigo, described as spinning and was positional.  She received the Epley maneuver, which helped, but she continues to feel a heavy  sensation of dizziness described as a "swimmy feeling".  It is left sided and constant.  Then over the past week, she developed a new posterior headache.  At night, it would wake her up and she would note a pounding headache in the back of her head, radiating to the left temple.  It would be less intense during the day.  MRI of brain and internal auditory canals with and without contrast and MRA of head from 06/27/15 were normal.  She was referred to Dr. Rosita Fire of ENT and was diagnosed with Meniere's.  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  She was prescribed Hygroton 25mg  daily with  Micro-K 10 mEq daily.  The dizziness resolved.  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  Since then, she would experience brief (seconds) dizziness with change in position.  In early 2019, she began experiencing these brief dizzy spells more frequently.  She began experiencing pressure in the suboccipital region again.  In February 2019, she woke up with severe dizziness.  It was not a spinning sensation but rather a sensation of an elevator quickly descending.  It is fairly constant and aggravated by position but it is not only positional.  Looking up or down aggravates it.  She also reports pain in the upper thoracic region with numbness and tingling radiating across the upper back.   Due to suboccipital headache and neck pain, she had an MRI of the cervical spine performed on 05/10/17, which was personally reviewed and was unremarkable.   She followed up with Dr. Dorma Russell of ENT, who believed her symptoms now may be migraine.  She was started on topiramate.  100mg  at bedtime and 50mg  twice daily both caused drowsiness, so she remained on 50mg  at bedtime.  She is not having the vertigo too much.  She may have a mild disequilibrium.  However, she has had an exacerbation of chronic fatigue.  She started experiencing myalgias.  She reports dysesthesias and numbness in her feet.  It is different than the paresthesias she feels in her fingertips and face due to the topiramate.  These are similar symptoms that she experienced 2 years ago.  She continues to have the burning pain in the upper thoracic region.  It is very distressing.  Labs from May 2019 included a positive ANA with centromere pattern and 1:320 titer.  However other labs were unremarkable including sed rate 6, negative Lyme, CK 40, TSH 1.690, and B12 458.  NCV-EMG was ordered but patient never had it performed.  She began having a recurrence of the atypical migraines on 06/22/2022.  Occipital pressure  radiating into the neck as well as temporal and bilateral retro-orbital pressure with left sided facial numbness, extreme fatigue, blurred/wavy vision, osmophobia.  Most prominent is visual motion sensitivity.  Symptoms have been persistent for 3 wakes thus far.  She had to miss two days of work.  She hasn't had a severe episode since 2019.  Since then, she has had mild episodes lasting up to a week from time to time.     Trigeminal neuralgia vs migraine She had Covid in January 2021.  During that time, she developed left sided facial pain.  She experienced severe sharp pain in the left temple, anterior to the left ear, and down along the left side of her jaw.  She had no corresponding rash.  Sometimes eating may aggravate it but not  talking or brushing her teeth.  Occasionally the pain may radiate to the left occipital region.  She also experienced aching in the left side of her jaw as well as tightness in the left side of her neck.  She also developed left sided facial numbness. Prior to Covid, she may have brief left sided jaw pain when eating, but it was infrequent.  Symptoms come and go.  She has been to the dentist.  She has crowns which were reportedly fine.  MRI of brain and trigeminal nerves with and without contrast on 12/06/2019 was normal.  Previously took baclofen.  Considered trigeminal neuralgia but also has had similar symptoms with her migraines.     She has history of menstrual migraines.  She reported an episode of numbness and tingling of the extremities, fatigue, weakness in hands and halitosis in 2007 following the birth of her first child.  She had a workup for MS, including MRIs which were negative.  She notes transient blurred vision.  Past medications:  topiramate, baclofen  PAST MEDICAL HISTORY: Past Medical History:  Diagnosis Date   Headache     MEDICATIONS: Current Outpatient Medications on File Prior to Visit  Medication Sig Dispense Refill   gabapentin (NEURONTIN) 100  MG capsule Take 1 capsule at bedtime for one week, then 2 capsules at bedtime for one week, then 3 capsules at bedtime 90 capsule 1   levonorgestrel (MIRENA) 20 MCG/24HR IUD 1 each by Intrauterine route continuous.      predniSONE (DELTASONE) 10 MG tablet Take 60mg  on day 1, then 50mg  on day 2, then 40mg  on day 3, then 30mg  on day 4, then 20mg  on day 5, then 10mg  on day 6, then STOP 21 tablet 0   venlafaxine XR (EFFEXOR-XR) 37.5 MG 24 hr capsule TAKE 1 CAPSULE BY MOUTH DAILY WITH BREAKFAST. 90 capsule 2   No current facility-administered medications on file prior to visit.    ALLERGIES: No Known Allergies  FAMILY HISTORY: Family History  Problem Relation Age of Onset   Thyroid disease Father    Hypertension Father    Stroke Father    Thyroid disease Mother    Osteoarthritis Maternal Grandmother    Cancer Maternal Grandfather        colon   Cancer Paternal Grandmother        unknown       Objective:  Blood pressure 114/76, pulse 80, height 5\' 5"  (1.651 m), weight 162 lb 12.8 oz (73.8 kg), SpO2 95%. General: No acute distress.  Patient appears well-groomed.   Head:  Normocephalic/atraumatic   Shon Millet, DO  CC: Juluis Rainier, MD

## 2022-12-17 ENCOUNTER — Encounter: Payer: Self-pay | Admitting: Neurology

## 2022-12-17 ENCOUNTER — Telehealth: Payer: Self-pay | Admitting: Neurology

## 2022-12-17 ENCOUNTER — Ambulatory Visit (INDEPENDENT_AMBULATORY_CARE_PROVIDER_SITE_OTHER): Payer: BC Managed Care – PPO | Admitting: Neurology

## 2022-12-17 VITALS — BP 114/76 | HR 80 | Ht 65.0 in | Wt 162.8 lb

## 2022-12-17 DIAGNOSIS — G43111 Migraine with aura, intractable, with status migrainosus: Secondary | ICD-10-CM | POA: Diagnosis not present

## 2022-12-17 MED ORDER — VENLAFAXINE HCL ER 37.5 MG PO CP24: 37.5000 mg | ORAL_CAPSULE | Freq: Every day | ORAL | 1 refills | Status: AC

## 2022-12-17 NOTE — Patient Instructions (Signed)
Start the venlafaxine as directed If you should begin having another suspected complicated/vestibular migraine, contact me and I will prescribe you another prednisone taper Follow up 6 months.

## 2022-12-18 ENCOUNTER — Encounter: Payer: Self-pay | Admitting: Neurology

## 2023-01-24 ENCOUNTER — Ambulatory Visit: Payer: BC Managed Care – PPO | Admitting: Neurology

## 2023-03-18 ENCOUNTER — Encounter: Payer: Self-pay | Admitting: Neurology

## 2023-06-25 ENCOUNTER — Telehealth: Payer: BC Managed Care – PPO | Admitting: Neurology

## 2023-06-27 ENCOUNTER — Telehealth: Payer: BC Managed Care – PPO | Admitting: Neurology

## 2023-07-31 ENCOUNTER — Encounter: Payer: Self-pay | Admitting: Gastroenterology

## 2023-09-12 ENCOUNTER — Ambulatory Visit (AMBULATORY_SURGERY_CENTER): Admitting: *Deleted

## 2023-09-12 VITALS — Ht 65.0 in | Wt 160.0 lb

## 2023-09-12 DIAGNOSIS — Z1211 Encounter for screening for malignant neoplasm of colon: Secondary | ICD-10-CM

## 2023-09-12 DIAGNOSIS — G500 Trigeminal neuralgia: Secondary | ICD-10-CM

## 2023-09-12 MED ORDER — NA SULFATE-K SULFATE-MG SULF 17.5-3.13-1.6 GM/177ML PO SOLN: 1.0000 | Freq: Once | ORAL | 0 refills | Status: AC

## 2023-09-12 NOTE — Progress Notes (Signed)
 Pt's name and DOB verified at the beginning of the pre-visit wit 2 identifiers  Pt denies any difficulty with ambulating,sitting, laying down or rolling side to side  Pt has no issues moving head neck or swallowing  No egg or soy allergy known to patient   No issues known to pt with past sedation with any surgeries or procedures  Patient denies ever being intubated  No FH of Malignant Hyperthermia  Pt is not on home 02   Pt is not on blood thinners   Pt  states she has intermittent constipation RN instructed pt to use Miralax per bottles instructions a week before prep days. Pt states they will  Pt is not on dialysis  Pt denise any abnormal heart rhythms   Pt denies any upcoming cardiac testing  Patient's chart reviewed by Norleen Schillings CNRA prior to pre-visit and patient appropriate for the [facility].  Pre-visit completed and red dot placed by patient's name on their procedure day (on provider's schedule).    Visit by phone  Pt states weight is 160 lb  IInstructions reviewed. Pt given  both [facility] main # and MD on call # prior to instructions.  Pt states understanding of instructions. Instructed pt to review instructions again prior to procedure and call main # given if has questions.. Pt states they will.   Instructed pt on where to find instructions on My Chart.

## 2023-09-15 ENCOUNTER — Encounter: Payer: Self-pay | Admitting: Gastroenterology

## 2023-09-19 ENCOUNTER — Encounter: Payer: Self-pay | Admitting: Advanced Practice Midwife

## 2023-09-26 ENCOUNTER — Encounter: Payer: Self-pay | Admitting: Gastroenterology

## 2023-09-26 ENCOUNTER — Ambulatory Visit: Admitting: Gastroenterology

## 2023-09-26 VITALS — BP 105/72 | HR 68 | Temp 97.9°F | Resp 16 | Ht 65.0 in | Wt 160.0 lb

## 2023-09-26 DIAGNOSIS — K644 Residual hemorrhoidal skin tags: Secondary | ICD-10-CM

## 2023-09-26 DIAGNOSIS — Q438 Other specified congenital malformations of intestine: Secondary | ICD-10-CM

## 2023-09-26 DIAGNOSIS — D125 Benign neoplasm of sigmoid colon: Secondary | ICD-10-CM

## 2023-09-26 DIAGNOSIS — Z1211 Encounter for screening for malignant neoplasm of colon: Secondary | ICD-10-CM | POA: Diagnosis present

## 2023-09-26 DIAGNOSIS — K6389 Other specified diseases of intestine: Secondary | ICD-10-CM | POA: Diagnosis not present

## 2023-09-26 DIAGNOSIS — K641 Second degree hemorrhoids: Secondary | ICD-10-CM

## 2023-09-26 DIAGNOSIS — K635 Polyp of colon: Secondary | ICD-10-CM

## 2023-09-26 DIAGNOSIS — D123 Benign neoplasm of transverse colon: Secondary | ICD-10-CM

## 2023-09-26 MED ORDER — SODIUM CHLORIDE 0.9 % IV SOLN: 500.0000 mL | INTRAVENOUS | Status: AC

## 2023-09-26 NOTE — Op Note (Signed)
 [facility] Patient Name: Tanya Bush Procedure Date: 09/26/2023 8:48 AM MRN: [id number] Endoscopist: Aloha Finner , MD, [id number] Age: 48 Referring MD:  Date of Birth: Dec 20, 1975 Gender: Female Account #: [id number] Procedure:                Colonoscopy Indications:              Screening for colorectal malignant neoplasm, This                            is the patient's first colonoscopy Medicines:                Monitored Anesthesia Care Procedure:                Pre-Anesthesia Assessment:                           - Prior to the procedure, a History and Physical                            was performed, and patient medications and                            allergies were reviewed. The patient's tolerance of                            previous anesthesia was also reviewed. The risks                            and benefits of the procedure and the sedation                            options and risks were discussed with the patient.                            All questions were answered, and informed consent                            was obtained. Prior Anticoagulants: The patient has                            taken no anticoagulant or antiplatelet agents. ASA                            Grade Assessment: II - A patient with mild systemic                            disease. After reviewing the risks and benefits,                            the patient was deemed in satisfactory condition to                            undergo the procedure.  After obtaining informed consent, the colonoscope                            was passed under direct vision. Throughout the                            procedure, the patient's blood pressure, pulse, and                            oxygen saturations were monitored continuously. The                            Olympus CF-HQ190L ([id number]) Colonoscope was                            introduced through the  anus and advanced to the 3                            cm into the ileum. The colonoscopy was performed                            without difficulty. The patient tolerated the                            procedure. The quality of the bowel preparation was                            good. The terminal ileum, ileocecal valve,                            appendiceal orifice, and rectum were photographed. Scope In: 9:04:58 AM Scope Out: 9:19:16 AM Scope Withdrawal Time: 0 hours 9 minutes 53 seconds  Total Procedure Duration: 0 hours 14 minutes 18 seconds  Findings:                 The digital rectal exam findings include                            hemorrhoids. Pertinent negatives include no                            palpable rectal lesions.                           The colon (entire examined portion) was moderately                            tortuous.                           The terminal ileum and ileocecal valve appeared                            normal.  Two sessile polyps were found in the sigmoid colon                            and hepatic flexure. The polyps were 2 to 3 mm in                            size. These polyps were removed with a cold snare.                            Resection and retrieval were complete.                           Normal mucosa was found in the entire colon                            otherwise.                           Non-bleeding non-thrombosed external and internal                            hemorrhoids were found during retroflexion, during                            perianal exam and during digital exam. The                            hemorrhoids were Grade II (internal hemorrhoids                            that prolapse but reduce spontaneously). Complications:            No immediate complications. Estimated Blood Loss:     Estimated blood loss was minimal. Impression:               - Hemorrhoids found on digital rectal  exam.                           - Tortuous colon.                           - The examined portion of the ileum was normal.                           - Two 2 to 3 mm polyps in the sigmoid colon and at                            the hepatic flexure, removed with a cold snare.                            Resected and retrieved.                           - Normal mucosa in the entire examined colon  otherwise.                           - Non-bleeding non-thrombosed external and internal                            hemorrhoids. Recommendation:           - The patient will be observed post-procedure,                            until all discharge criteria are met.                           - Discharge patient to home.                           - Patient has a contact number available for                            emergencies. The signs and symptoms of potential                            delayed complications were discussed with the                            patient. Return to normal activities tomorrow.                            Written discharge instructions were provided to the                            patient.                           - High fiber diet.                           - Use FiberCon 1-2 tablets PO daily.                           - Continue present medications.                           - Await pathology results.                           - Repeat colonoscopy in 5-10 years for surveillance                            based on pathology results.                           - The findings and recommendations were discussed                            with the patient.                           -  The findings and recommendations were discussed                            with the patient's family. Aloha Finner, MD 09/26/2023 9:23:41 AM

## 2023-09-26 NOTE — Progress Notes (Signed)
 To pacu, VSS. Report to [email or web address]

## 2023-09-26 NOTE — Patient Instructions (Signed)
 YOU HAD AN ENDOSCOPIC PROCEDURE TODAY AT THE [facility]:   Refer to the procedure report that was given to you for any specific questions about what was found during the examination.  If the procedure report does not answer your questions, please call your gastroenterologist to clarify.  If you requested that your care partner not be given the details of your procedure findings, then the procedure report has been included in a sealed envelope for you to review at your convenience later.  YOU SHOULD EXPECT: Some feelings of bloating in the abdomen. Passage of more gas than usual.  Walking can help get rid of the air that was put into your GI tract during the procedure and reduce the bloating. If you had a lower endoscopy (such as a colonoscopy or flexible sigmoidoscopy) you may notice spotting of blood in your stool or on the toilet paper. If you underwent a bowel prep for your procedure, you may not have a normal bowel movement for a few days.  Please Note:  You might notice some irritation and congestion in your nose or some drainage.  This is from the oxygen used during your procedure.  There is no need for concern and it should clear up in a day or so.  SYMPTOMS TO REPORT IMMEDIATELY:  Following lower endoscopy (colonoscopy or flexible sigmoidoscopy):  Excessive amounts of blood in the stool  Significant tenderness or worsening of abdominal pains  Swelling of the abdomen that is new, acute  Fever of 100F or higher  For urgent or emergent issues, a gastroenterologist can be reached at any hour by calling [phone]. Do not use MyChart messaging for urgent concerns.    DIET:  We do recommend a small meal at first, but then you may proceed to your regular diet.  Drink plenty of fluids but you should avoid alcoholic beverages for 24 hours.  MEDICATIONS: Continue present medications. Use FiberCon 1-2 tablets by mouth daily.  FOLLOW UP: Await pathology results. Repeat  colonoscopy in 5-10 years for surveillance based on pathology results.  Handouts given to patient: High Fiber Diet, Polyps, Hemorrhoids.  Thank you for allowing us  to provide for your healthcare needs today.  ACTIVITY:  You should plan to take it easy for the rest of today and you should NOT DRIVE or use heavy machinery until tomorrow (because of the sedation medicines used during the test).    FOLLOW UP: Our staff will call the number listed on your records the next business day following your procedure.  We will call around 7:15- 8:00 am to check on you and address any questions or concerns that you may have regarding the information given to you following your procedure. If we do not reach you, we will leave a message.     If any biopsies were taken you will be contacted by phone or by letter within the next 1-3 weeks.  Please call us  at [phone] if you have not heard about the biopsies in 3 weeks.    SIGNATURES/CONFIDENTIALITY: You and/or your care partner have signed paperwork which will be entered into your electronic medical record.  These signatures attest to the fact that that the information above on your After Visit Summary has been reviewed and is understood.  Full responsibility of the confidentiality of this discharge information lies with you and/or your care-partner.

## 2023-09-26 NOTE — Progress Notes (Signed)
 GASTROENTEROLOGY PROCEDURE H&P NOTE   Primary Care Physician: No primary care provider on file.  HPI: Tanya Bush is a 48 y.o. female who presents for colonoscopy for screening.  Past Medical History:  Diagnosis Date   Anemia    Anxiety    Cervical dysplasia    Constipation    Epstein Barr infection    Dormant   Headache    Migraines    Past Surgical History:  Procedure Laterality Date   CERVICAL BIOPSY  W/ LOOP ELECTRODE EXCISION     THYROID  CYST EXCISION     Current Outpatient Medications  Medication Sig Dispense Refill   levonorgestrel  (MIRENA ) 20 MCG/24HR IUD 1 each by Intrauterine route continuous.      gabapentin  (NEURONTIN ) 100 MG capsule Take 1 capsule at bedtime for one week, then 2 capsules at bedtime for one week, then 3 capsules at bedtime (Patient not taking: Reported on 09/26/2023) 90 capsule 1   Multiple Vitamin (MULTIVITAMIN ADULT PO) Take by mouth.     venlafaxine  XR (EFFEXOR -XR) 37.5 MG 24 hr capsule Take 1 capsule (37.5 mg total) by mouth daily with breakfast. (Patient not taking: Reported on 09/12/2023) 90 capsule 1   Current Facility-Administered Medications  Medication Dose Route Frequency Provider Last Rate Last Admin   0.9 %  sodium chloride infusion  500 mL Intravenous Continuous Mansouraty, Katleen Carraway Jr., MD        Current Outpatient Medications:    levonorgestrel  (MIRENA ) 20 MCG/24HR IUD, 1 each by Intrauterine route continuous. , Disp: , Rfl:    gabapentin  (NEURONTIN ) 100 MG capsule, Take 1 capsule at bedtime for one week, then 2 capsules at bedtime for one week, then 3 capsules at bedtime (Patient not taking: Reported on 09/26/2023), Disp: 90 capsule, Rfl: 1   Multiple Vitamin (MULTIVITAMIN ADULT PO), Take by mouth., Disp: , Rfl:    venlafaxine  XR (EFFEXOR -XR) 37.5 MG 24 hr capsule, Take 1 capsule (37.5 mg total) by mouth daily with breakfast. (Patient not taking: Reported on 09/12/2023), Disp: 90 capsule, Rfl: 1  Current Facility-Administered  Medications:    0.9 %  sodium chloride infusion, 500 mL, Intravenous, Continuous, Mansouraty, Aloha Raddle., MD Allergies  Allergen Reactions   Azithromycin Nausea Only and Other (See Comments)   Family History  Problem Relation Age of Onset   Thyroid  disease Mother    Thyroid  disease Father    Hypertension Father    Stroke Father    Osteoarthritis Maternal Grandmother    Stomach cancer Maternal Grandfather    Colon cancer Paternal Grandmother    Colon polyps Neg Hx    Esophageal cancer Neg Hx    Rectal cancer Neg Hx    Social History   Socioeconomic History   Marital status: Married    Spouse name: Not on file   Number of children: Not on file   Years of education: Not on file   Highest education level: Not on file  Occupational History   Not on file  Tobacco Use   Smoking status: Former   Smokeless tobacco: Never  Vaping Use   Vaping status: Never Used  Substance and Sexual Activity   Alcohol use: Yes    Alcohol/week: 0.0 standard drinks of alcohol    Comment: ocassionally weekend   Drug use: No   Sexual activity: Yes    Partners: Male    Birth control/protection: I.U.D.    Comment: Mirena  inserted 08/2005  Other Topics Concern   Not on file  Social History Narrative  Right handed   One story home   Drinks caffeine    Social Drivers of Health   Financial Resource Strain: Not on file  Food Insecurity: Not on file  Transportation Needs: Not on file  Physical Activity: Not on file  Stress: Not on file  Social Connections: Not on file  Intimate Partner Violence: Not on file    Physical Exam: Today's Vitals   09/26/23 0912 09/26/23 0916 09/26/23 0917 09/26/23 0919  BP: 114/67 96/65 99/63    Pulse: 86 84 89 81  Resp: 18 17 20 15   Temp:      TempSrc:      SpO2: 97% 96% 96% 95%  Weight:      Height:       Body mass index is 26.63 kg/m. GEN: NAD EYE: Sclerae anicteric ENT: MMM CV: Non-tachycardic GI: Soft, NT/ND NEURO:  Alert & Oriented x 3  Lab  Results: No results for input(s): WBC, HGB, HCT, PLT in the last 72 hours. BMET No results for input(s): NA, K, CL, CO2, GLUCOSE, BUN, CREATININE, CALCIUM in the last 72 hours. LFT No results for input(s): PROT, ALBUMIN, AST, ALT, ALKPHOS, BILITOT, BILIDIR, IBILI in the last 72 hours. PT/INR No results for input(s): LABPROT, INR in the last 72 hours.   Impression / Plan: This is a 48 y.o.female who presents for colonoscopy for screening.  The risks and benefits of endoscopic evaluation/treatment were discussed with the patient and/or family; these include but are not limited to the risk of perforation, infection, bleeding, missed lesions, lack of diagnosis, severe illness requiring hospitalization, as well as anesthesia and sedation related illnesses.  The patient's history has been reviewed, patient examined, no change in status, and deemed stable for procedure.  The patient and/or family is agreeable to proceed.    Aloha Finner, MD [facility] Office # [phone]

## 2023-09-26 NOTE — Progress Notes (Signed)
 Called to room to assist during endoscopic procedure.  Patient ID and intended procedure confirmed with present staff. Received instructions for my participation in the procedure from the performing physician.

## 2023-09-29 ENCOUNTER — Telehealth: Payer: Self-pay | Admitting: *Deleted

## 2023-09-29 NOTE — Telephone Encounter (Signed)
  Follow up Call-     09/26/2023    8:02 AM  Call back number  Post procedure Call Back phone  # [phone]  Permission to leave phone message Yes     Patient questions:  Do you have a fever, pain , or abdominal swelling? No. Pain Score  0 *  Have you tolerated food without any problems? Yes.    Have you been able to return to your normal activities? Yes.    Do you have any questions about your discharge instructions: Diet   No. Medications  No. Follow up visit  No.  Do you have questions or concerns about your Care? No.  Actions: * If pain score is 4 or above: No action needed, pain <4.

## 2023-10-01 ENCOUNTER — Ambulatory Visit: Payer: Self-pay | Admitting: Gastroenterology

## 2023-10-01 LAB — SURGICAL PATHOLOGY

## 2024-07-02 ENCOUNTER — Ambulatory Visit: Admitting: Family Medicine
